# Patient Record
Sex: Male | Born: 1955 | Race: Black or African American | Hispanic: No | Marital: Single | Smoking: Never smoker
Health system: Southern US, Community
[De-identification: ages and names within clinical notes are randomized; demographics above are authoritative.]

## PROBLEM LIST (undated history)

## (undated) DIAGNOSIS — I10 Essential (primary) hypertension: Secondary | ICD-10-CM

## (undated) DIAGNOSIS — K429 Umbilical hernia without obstruction or gangrene: Secondary | ICD-10-CM

## (undated) DIAGNOSIS — E119 Type 2 diabetes mellitus without complications: Secondary | ICD-10-CM

## (undated) DIAGNOSIS — IMO0001 Reserved for inherently not codable concepts without codable children: Secondary | ICD-10-CM

## (undated) HISTORY — PX: CERVICAL FUSION: SHX112

## (undated) HISTORY — PX: ORIF PROXIMAL TIBIAL PLATEAU FRACTURE: SUR953

---

## 2000-10-05 ENCOUNTER — Emergency Department (HOSPITAL_COMMUNITY): Admission: EM | Admit: 2000-10-05 | Discharge: 2000-10-05 | Payer: Self-pay

## 2001-06-05 ENCOUNTER — Encounter: Payer: Self-pay | Admitting: Emergency Medicine

## 2001-06-05 ENCOUNTER — Emergency Department (HOSPITAL_COMMUNITY): Admission: EM | Admit: 2001-06-05 | Discharge: 2001-06-05 | Payer: Self-pay | Admitting: Emergency Medicine

## 2005-12-30 ENCOUNTER — Ambulatory Visit: Payer: Self-pay | Admitting: Internal Medicine

## 2005-12-30 LAB — CONVERTED CEMR LAB
ALT: 32 units/L (ref 0–40)
AST: 21 units/L (ref 0–37)
Albumin: 3.9 g/dL (ref 3.5–5.2)
Alkaline Phosphatase: 63 units/L (ref 39–117)
BUN: 15 mg/dL (ref 6–23)
Basophils Absolute: 0 10*3/uL (ref 0.0–0.1)
Basophils Relative: 0 % (ref 0.0–1.0)
Bilirubin Urine: NEGATIVE
CO2: 27 meq/L (ref 19–32)
Calcium: 9.7 mg/dL (ref 8.4–10.5)
Chloride: 105 meq/L (ref 96–112)
Chol/HDL Ratio, serum: 9.4
Cholesterol: 337 mg/dL (ref 0–200)
Creatinine, Ser: 1.5 mg/dL (ref 0.4–1.5)
Eosinophil percent: 5.6 % — ABNORMAL HIGH (ref 0.0–5.0)
GFR calc non Af Amer: 53 mL/min
Glomerular Filtration Rate, Af Am: 64 mL/min/{1.73_m2}
Glucose, Bld: 124 mg/dL — ABNORMAL HIGH (ref 70–99)
HCT: 44.2 % (ref 39.0–52.0)
HDL: 36 mg/dL — ABNORMAL LOW (ref >39.0)
Hemoglobin, Urine: NEGATIVE
Hemoglobin: 14.5 g/dL (ref 13.0–17.0)
Ketones, ur: NEGATIVE mg/dL
LDL DIRECT: 121.9 mg/dL
Leukocytes, UA: NEGATIVE
Lymphocytes Relative: 43 % (ref 12.0–46.0)
MCHC: 32.8 g/dL (ref 30.0–36.0)
MCV: 86.2 fL (ref 78.0–100.0)
Monocytes Absolute: 0.4 10*3/uL (ref 0.2–0.7)
Monocytes Relative: 6 % (ref 3.0–11.0)
Neutro Abs: 3 10*3/uL (ref 1.4–7.7)
Neutrophils Relative %: 45.4 % (ref 43.0–77.0)
Nitrite: NEGATIVE
PSA: 0.64 ng/mL (ref 0.10–4.00)
Platelets: 235 10*3/uL (ref 150–400)
Potassium: 4 meq/L (ref 3.5–5.1)
RBC: 5.13 M/uL (ref 4.22–5.81)
RDW: 13.1 % (ref 11.5–14.6)
Sodium: 141 meq/L (ref 135–145)
Specific Gravity, Urine: 1.025 (ref 1.000–1.03)
TSH: 1.86 microintl units/mL (ref 0.35–5.50)
Total Bilirubin: 0.8 mg/dL (ref 0.3–1.2)
Total Protein, Urine: NEGATIVE mg/dL
Total Protein: 7.3 g/dL (ref 6.0–8.3)
Triglyceride fasting, serum: 684 mg/dL (ref 0–149)
Urine Glucose: NEGATIVE mg/dL
Urobilinogen, UA: 0.2 (ref 0.0–1.0)
VLDL: 137 mg/dL — ABNORMAL HIGH (ref 0–40)
WBC: 6.6 10*3/uL (ref 4.5–10.5)
pH: 5.5 (ref 5.0–8.0)

## 2006-01-01 ENCOUNTER — Ambulatory Visit: Payer: Self-pay | Admitting: Internal Medicine

## 2006-02-07 ENCOUNTER — Ambulatory Visit: Payer: Self-pay | Admitting: Internal Medicine

## 2006-06-11 ENCOUNTER — Ambulatory Visit: Payer: Self-pay | Admitting: Internal Medicine

## 2006-06-11 LAB — CONVERTED CEMR LAB
ALT: 31 units/L (ref 0–40)
AST: 24 units/L (ref 0–37)
Albumin: 3.9 g/dL (ref 3.5–5.2)
Alkaline Phosphatase: 63 units/L (ref 39–117)
BUN: 9 mg/dL (ref 6–23)
Bilirubin, Direct: 0.1 mg/dL (ref 0.0–0.3)
CO2: 27 meq/L (ref 19–32)
Calcium: 9.5 mg/dL (ref 8.4–10.5)
Chloride: 107 meq/L (ref 96–112)
Cholesterol: 340 mg/dL (ref 0–200)
Creatinine, Ser: 1.2 mg/dL (ref 0.4–1.5)
Creatinine,U: 145.4 mg/dL
Direct LDL: 111 mg/dL
GFR calc Af Amer: 83 mL/min
GFR calc non Af Amer: 68 mL/min
Glucose, Bld: 98 mg/dL (ref 70–99)
HDL: 45.2 mg/dL (ref >39.0)
Hgb A1c MFr Bld: 6.7 % — ABNORMAL HIGH (ref 4.6–6.0)
Microalb Creat Ratio: 13.8 mg/g (ref 0.0–30.0)
Microalb, Ur: 2 mg/dL — ABNORMAL HIGH (ref 0.0–1.9)
Potassium: 4 meq/L (ref 3.5–5.1)
Sodium: 142 meq/L (ref 135–145)
Total Bilirubin: 0.8 mg/dL (ref 0.3–1.2)
Total CHOL/HDL Ratio: 7.5
Total Protein: 7.1 g/dL (ref 6.0–8.3)
Triglycerides: 919 mg/dL (ref 0–149)
VLDL: 184 mg/dL — ABNORMAL HIGH (ref 0–40)

## 2006-06-16 ENCOUNTER — Ambulatory Visit: Payer: Self-pay | Admitting: Internal Medicine

## 2006-06-24 ENCOUNTER — Ambulatory Visit: Payer: Self-pay | Admitting: Gastroenterology

## 2006-07-08 ENCOUNTER — Encounter: Payer: Self-pay | Admitting: Gastroenterology

## 2006-07-08 ENCOUNTER — Ambulatory Visit: Payer: Self-pay | Admitting: Gastroenterology

## 2006-11-10 ENCOUNTER — Ambulatory Visit: Payer: Self-pay | Admitting: Internal Medicine

## 2006-11-21 ENCOUNTER — Ambulatory Visit: Payer: Self-pay | Admitting: Internal Medicine

## 2006-11-21 LAB — CONVERTED CEMR LAB
ALT: 34 units/L (ref 0–53)
AST: 28 units/L (ref 0–37)
BUN: 13 mg/dL (ref 6–23)
CO2: 33 meq/L — ABNORMAL HIGH (ref 19–32)
Calcium: 9.8 mg/dL (ref 8.4–10.5)
Chloride: 106 meq/L (ref 96–112)
Cholesterol: 162 mg/dL (ref 0–200)
Creatinine, Ser: 1.3 mg/dL (ref 0.4–1.5)
Creatinine,U: 192 mg/dL
Direct LDL: 67.3 mg/dL
GFR calc Af Amer: 75 mL/min
GFR calc non Af Amer: 62 mL/min
Glucose, Bld: 89 mg/dL (ref 70–99)
HDL: 30.2 mg/dL — ABNORMAL LOW (ref >39.0)
Hgb A1c MFr Bld: 6.9 % — ABNORMAL HIGH (ref 4.6–6.0)
Microalb Creat Ratio: 8.3 mg/g (ref 0.0–30.0)
Microalb, Ur: 1.6 mg/dL (ref 0.0–1.9)
Potassium: 4.3 meq/L (ref 3.5–5.1)
Sodium: 144 meq/L (ref 135–145)
TSH: 1.32 microintl units/mL (ref 0.35–5.50)
Testosterone: 328.69 ng/dL — ABNORMAL LOW (ref 350.00–890)
Total CHOL/HDL Ratio: 5.4
Triglycerides: 378 mg/dL (ref 0–149)
VLDL: 76 mg/dL — ABNORMAL HIGH (ref 0–40)

## 2007-02-07 DIAGNOSIS — N183 Chronic kidney disease, stage 3 unspecified: Secondary | ICD-10-CM | POA: Insufficient documentation

## 2007-02-07 DIAGNOSIS — I10 Essential (primary) hypertension: Secondary | ICD-10-CM | POA: Insufficient documentation

## 2007-02-07 DIAGNOSIS — N189 Chronic kidney disease, unspecified: Secondary | ICD-10-CM | POA: Insufficient documentation

## 2007-02-07 DIAGNOSIS — M542 Cervicalgia: Secondary | ICD-10-CM

## 2007-02-07 DIAGNOSIS — E785 Hyperlipidemia, unspecified: Secondary | ICD-10-CM

## 2007-02-07 DIAGNOSIS — E119 Type 2 diabetes mellitus without complications: Secondary | ICD-10-CM | POA: Insufficient documentation

## 2007-02-12 ENCOUNTER — Ambulatory Visit: Payer: Self-pay | Admitting: Internal Medicine

## 2007-02-12 DIAGNOSIS — J069 Acute upper respiratory infection, unspecified: Secondary | ICD-10-CM | POA: Insufficient documentation

## 2007-02-12 DIAGNOSIS — F528 Other sexual dysfunction not due to a substance or known physiological condition: Secondary | ICD-10-CM

## 2007-02-12 DIAGNOSIS — N529 Male erectile dysfunction, unspecified: Secondary | ICD-10-CM | POA: Insufficient documentation

## 2007-03-02 ENCOUNTER — Encounter: Payer: Self-pay | Admitting: Internal Medicine

## 2007-05-13 ENCOUNTER — Encounter: Payer: Self-pay | Admitting: Internal Medicine

## 2010-05-18 ENCOUNTER — Inpatient Hospital Stay (HOSPITAL_COMMUNITY)
Admission: EM | Admit: 2010-05-18 | Discharge: 2010-05-23 | DRG: 494 | Disposition: A | Discharge status: Home Health | Payer: Non-veteran care | Source: Other Acute Inpatient Hospital | Attending: Orthopaedic Surgery | Admitting: Orthopaedic Surgery

## 2010-05-18 ENCOUNTER — Emergency Department (HOSPITAL_COMMUNITY)
Admission: EM | Admit: 2010-05-18 | Discharge: 2010-05-18 | Disposition: A | Discharge status: Other Acute Inpatient Hospital | Payer: Non-veteran care | Source: Home / Self Care | Attending: Emergency Medicine | Admitting: Emergency Medicine

## 2010-05-18 ENCOUNTER — Emergency Department (HOSPITAL_COMMUNITY): Payer: Non-veteran care

## 2010-05-18 DIAGNOSIS — S82109A Unspecified fracture of upper end of unspecified tibia, initial encounter for closed fracture: Principal | ICD-10-CM | POA: Diagnosis present

## 2010-05-18 DIAGNOSIS — S82409A Unspecified fracture of shaft of unspecified fibula, initial encounter for closed fracture: Secondary | ICD-10-CM | POA: Insufficient documentation

## 2010-05-18 DIAGNOSIS — Y998 Other external cause status: Secondary | ICD-10-CM

## 2010-05-18 DIAGNOSIS — Y92009 Unspecified place in unspecified non-institutional (private) residence as the place of occurrence of the external cause: Secondary | ICD-10-CM | POA: Insufficient documentation

## 2010-05-18 DIAGNOSIS — I10 Essential (primary) hypertension: Secondary | ICD-10-CM | POA: Diagnosis present

## 2010-05-18 DIAGNOSIS — Z01812 Encounter for preprocedural laboratory examination: Secondary | ICD-10-CM

## 2010-05-18 DIAGNOSIS — Y9352 Activity, horseback riding: Secondary | ICD-10-CM | POA: Insufficient documentation

## 2010-05-18 LAB — DIFFERENTIAL
Lymphocytes Relative: 35 % (ref 12–46)
Lymphs Abs: 2.1 10*3/uL (ref 0.7–4.0)
Monocytes Relative: 8 % (ref 3–12)
Neutro Abs: 3 10*3/uL (ref 1.7–7.7)
Neutrophils Relative %: 49 % (ref 43–77)

## 2010-05-18 LAB — BASIC METABOLIC PANEL
BUN: 12 mg/dL (ref 6–23)
CO2: 23 mEq/L (ref 19–32)
Chloride: 108 mEq/L (ref 96–112)
Creatinine, Ser: 1.33 mg/dL (ref 0.4–1.5)
Potassium: 4.2 mEq/L (ref 3.5–5.1)

## 2010-05-18 LAB — CBC
HCT: 39.2 % (ref 39.0–52.0)
Hemoglobin: 13.4 g/dL (ref 13.0–17.0)
MCH: 28.5 pg (ref 26.0–34.0)
MCV: 83.2 fL (ref 78.0–100.0)
RBC: 4.71 MIL/uL (ref 4.22–5.81)

## 2010-05-19 ENCOUNTER — Inpatient Hospital Stay (HOSPITAL_COMMUNITY): Payer: Non-veteran care

## 2010-05-19 LAB — GLUCOSE, CAPILLARY
Glucose-Capillary: 264 mg/dL — ABNORMAL HIGH (ref 70–99)
Glucose-Capillary: 141 mg/dL — ABNORMAL HIGH (ref 70–99)
Glucose-Capillary: 242 mg/dL — ABNORMAL HIGH (ref 70–99)
Glucose-Capillary: 235 mg/dL — ABNORMAL HIGH (ref 70–99)

## 2010-05-20 LAB — GLUCOSE, CAPILLARY
Glucose-Capillary: 161 mg/dL — ABNORMAL HIGH (ref 70–99)
Glucose-Capillary: 92 mg/dL (ref 70–99)
Glucose-Capillary: 162 mg/dL — ABNORMAL HIGH (ref 70–99)
Glucose-Capillary: 119 mg/dL — ABNORMAL HIGH (ref 70–99)

## 2010-05-21 LAB — GLUCOSE, CAPILLARY
Glucose-Capillary: 106 mg/dL — ABNORMAL HIGH (ref 70–99)
Glucose-Capillary: 161 mg/dL — ABNORMAL HIGH (ref 70–99)
Glucose-Capillary: 119 mg/dL — ABNORMAL HIGH (ref 70–99)

## 2010-05-22 LAB — GLUCOSE, CAPILLARY: Glucose-Capillary: 88 mg/dL (ref 70–99)

## 2010-05-23 LAB — GLUCOSE, CAPILLARY
Glucose-Capillary: 156 mg/dL — ABNORMAL HIGH (ref 70–99)
Glucose-Capillary: 183 mg/dL — ABNORMAL HIGH (ref 70–99)

## 2010-05-29 NOTE — Op Note (Signed)
NAME:  Joseph Conrad, Joseph Conrad           ACCOUNT NO.:  [id number]  MEDICAL RECORD NO.:  [id number]           PATIENT TYPE:  I  LOCATION:  5001                         FACILITY:  [facility]  PHYSICIAN:  Roberto Hlavaty C. Lorin Mercy, M.D.    DATE OF BIRTH:  October 20, 1955  DATE OF PROCEDURE:  05/19/2010 DATE OF DISCHARGE:                              OPERATIVE REPORT   POSTOPERATIVE DIAGNOSIS:  Left bicondylar tibial plateau fracture with comminution.  PROCEDURE:  ORIF bicondylar tibial plateau fracture with posterior medial plating and lateral plating.  SURGEON:  Rodell Perna, MD  ANESTHESIA:  GOT.  TOURNIQUET TIME:  Up time 67 minutes x350, down x15 and up time 40 minutes.  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  He suffered the above comminuted injury documented by CT scan with a large posterior medial fragment displaced.  After informed consent, standard prep and draping, proximal tourniquet application, Foley insertion, leg was prepped from the 10/15 drape applied at the tourniquet down to the ankle, the usual impervious stockinette, Coban, extremity sheets and drapes were applied.  It had been discussed with the patient this was severe comminuted fracture.  It may require total knee arthroplasty and even require plating based on CT findings with the large fragment displaced.  After time-out procedure, sterile skin marker, Betadine, Steri-Strips application, leg was elevated, wrapped in Esmarch and the posterior medial incision was made interval between the semitendinosus, medial hamstrings, and gastrocs were developed.  Portion of the popliteal popliteus peeled off the cortex exposing the fracture fragment.  The patient had a very large gastroc which made exposure very difficult. With distraction, the piece was getting better position, however,  the side of the leg with the patient flexed and externally rotated would displace the fragment.  With distraction with pressure, the patient got better, however, did not allow exposure.  Continued work was performed finally using a large tenaculum clamp for compression against the anterior portion of the tibial tubercle.  It held and K-wires were inserted.  A titanium 3.5 compression plate DePuy was selected custom bent, fashioned up against plateau and then screws were inserted buttressing the depressed fragment.  Non locking screws were placed on the ends and this was compressed down.  One of the screws were too long, went into the fibula and this was changed near the end of the case. Once this was confirmed, this took an hour of difficult work in order to finally get the piece reduced.  Once it was fixed, attention was turned laterally.  Tourniquet was deflated while the medial side was being irrigated, cauterization, and then layered closure with 0 Vicryl deep fascia, 2-0 Vicryl subcutaneous tissue, and then skin staple closure. The skin was not closed at the very end of the case and attention was turned to the lateral side.  The leg was elevated, again wrapped in Esmarch, tourniquet inflated.  S-shaped incision was opened. Subperiosteal dissection was performed.  Fracture fragments were reduced, compressed.  The DePuy  anatomic lateral plate was selected and placed in the femur and non locking screws were placed proximally.  The patient had a large leg.  One of the angle screws, little bit long was removed back down and distally a nonlocking screw was placed at end of the case.  It was exchanged for a locking screw.  Screwdriver was inserted over the medial side and the long angled screw that had gone into the fibula was removed.  This was a 70 exchanged for 55 which was appropriate length.  Final spot pictures were taken showing good position and alignment.  Repeat irrigation and  then standard layered closure with 0 and #1 Vicryl in the deep fascia, interrupted after Hemovac drain was placed.  Lateral side was dry.  Compartments were soft throughout the case.  The patient had palpable pulses.  Spot fluoro pictures were taken showing position and Xeroform, 4x4s, Webril, Ace wrap, and reapplication of the knee immobilizer performed after skin had been closed with the staples medial and laterally.  The patient was transferred to recovery room in stable condition.     Joseph Conrad C. Lorin Mercy, M.D.     MCY/MEDQ  D:  05/19/2010  T:  05/20/2010  Job:  [id number]  Electronically Signed by Rodell Perna M.D. on 05/29/2010 06:01:22 PM

## 2010-07-02 NOTE — Discharge Summary (Signed)
NAME:  Joseph Conrad, Joseph Conrad           ACCOUNT NO.:  [id number]  MEDICAL RECORD NO.:  [id number]           PATIENT TYPE:  I  LOCATION:  5001                         FACILITY:  [facility]  PHYSICIAN:  Brandon Scarbrough C. Lorin Mercy, M.D.    DATE OF BIRTH:  [birth year]  DATE OF ADMISSION:  05/18/2010 DATE OF DISCHARGE:  05/23/2010                              DISCHARGE SUMMARY   ADMISSION DIAGNOSES: 1. Left bicondylar tibial plateau fracture. 2. Insulin-dependent diabetes mellitus. 3. Obesity. 4. Hypertension.  DISCHARGE DIAGNOSES: 1. Left bicondylar tibial plateau fracture. 2. Insulin-dependent diabetes mellitus. 3. Obesity. 4. Hypertension.  PROCEDURE:  On May 19, 2010, the patient underwent open reduction and internal fixation bicondylar tibial plateau fracture with posterior medial plating and lateral plating performed by Dr. Lorin Mercy under general anesthesia.  CONSULTATIONS:  None.  BRIEF HISTORY:  The patient is a 55 year old male who has fallen from a horse on the day of admission.  He was brought to the emergency department where radiographs showed a bicondylar fracture of the left tibial plateau.  He underwent CT scan showing large posterior medial fragment which was displaced.  It was felt he would benefit from surgical intervention.  Compartments were checked and he was found to be without evidence of compartment syndrome.  The patient underwent the above-stated procedure on May 19, 2010, under general anesthesia without complications.  Postoperatively, neurovascular motor function of lower extremities was intact and he continued to have soft compartments. Strict elevation was utilized.  Knee immobilizer was utilized.  He was started on physical therapy for ambulation and gait training touchdown weightbearing to the operative extremity.  The patient was treated with IV analgesics initially and weaned to p.o. analgesics without difficulty.  He did have elevated temperatures which was  treated with incentive spirometry.  The patient was on enteric-coated aspirin 325 mg p.o. b.i.d.  At discharge, he was placed on Coumadin 1 mg daily for 25 days by Dr. Lorin Mercy for DVT prophylaxis.  Prior to discharge, he was able to ambulate to the hallway and maintain his touchdown weightbearing status.  Occupational Therapy assisted with ADLs and he was independent at the time of discharge.  PERTINENT LABORATORY VALUES:  The only labs available on the chart are those of his blood sugars.  I am unable to locate the usual preoperative labs which are performed.  PLAN:  The patient was discharged to his home.  He was instructed to continue wearing his knee immobilizer full time and to be touchdown weightbearing on the operative extremity utilizing a walker.  He will keep his dressing dry and clean at all times.  He will follow up with Dr. Lorin Mercy in 7 days.  MEDICATIONS AT DISCHARGE: 1. Percocet 5/325 one to two every 4-6 hours as needed for pain. 2. Coumadin 1 mg daily for 25 days. 3. Robaxin 500 mg one every 6 hours as needed for spasm.  He will continue on his home medication as taken prior to admission and was given a medication reconciliation form with these instructions.  The patient will call the office should he have questions or concerns prior to his return office visit.  CONDITION ON DISCHARGE:  Stable.     Epimenio Foot, P.A.   ______________________________ Thana Farr Lorin Mercy, M.D.    SMV/MEDQ  D:  06/21/2010  T:  06/21/2010  Job:  [id number]  Electronically Signed by Phillips Hay P.A. on 06/22/2010 12:46:20 PM Electronically Signed by Rodell Perna M.D. on 07/02/2010 04:11:38 PM

## 2010-07-20 NOTE — Assessment & Plan Note (Signed)
[facility]                             PRIMARY CARE OFFICE NOTE   NAME:Konkel, BAKARY KARRICK                  MRN:          [id number]  DATE:01/01/2006                            DOB:          [birth year]    CHIEF COMPLAINT:  New patient to practice.   HISTORY OF PRESENT ILLNESS:  The patient is a 55 year old African-American  male here to establish primary care.  Although he had seen Dr. Melvyn Novas in the  past of [facility], he has not had a local primary care  physician.  He was being followed by a [facility] out of [place], [place].  He states that he was diagnosed with type 2 diabetes in 2006.  His initial blood sugar when he was first hospitalized was 943.  Since that  time, the patient has undergone diabetic education and was taking insulin  which was ultimately discontinued due to the fact that his blood sugar had  improved significantly and was stable on Metformin therapy.  He did run out  of Metformin approximately 2 weeks ago, has been unable to get refills from  the [place], which he is somewhat frustrated with.   He also had hyperlipidemia but has not taken any cholesterol medication  recently.  He was prescribed a statin in the past.  His previous notes also  list Lopid 600 mg b.i.d., last prescribed in 2002.   He is up to date with diabetic eye exam from the [place].  He was, at one point,  taking a baby aspirin but he discontinued.   He denies any history of cardiovascular disease including MI or CVA.  No  history of peripheral vascular disease.   REVIEW OF SYSTEMS:  The patient denies any chest pain or shortness of  breath.  He has mild exercise intolerance.  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  He does not exercise on a regular basis and he has been taking an over-the-  counter supplement for the weight loss within the last 1 month time.   CURRENT  MEDICATIONS:  1. Lisinopril/hydrochlorothiazide 20/12.5 one a day.  2. Metformin 1000 mg b.i.d.   ALLERGIES TO MEDICATIONS:  None known.   PAST MEDICAL HISTORY SUMMARY:  1. Type 2 diabetes.  2. Hyperlipidemia.  3. Morbid obesity.  4. History of neck pain status post cervical fusion in the remote past.   SOCIAL HISTORY:  The patient is divorced.  He has been married twice.  He  has a total of 6 children from his first and his second marriage combined.   FAMILY HISTORY:  Father deceased at age 38, had a past medical history of  lung cancer and was diabetic.  Mother is alive at age 90.  She has  hypertension, history of CVA.  The patient has 4 siblings; one sister is  known to be hypertensive.  Denies any family history of colon cancer.   HABITS:  Occasional alcohol.  Has never used tobacco products in the past.   LABORATORY DATA:  CBC showed H and H of 14.5 and 44.2.  WBCs 6.6.  Platelet  count of 235.  Comprehensive metabolic profile notable for BUN 15,  creatinine 1.5.  Fasting glucose of 124.  His lipid panel shows a total  cholesterol 337, triglycerides 684, HDL 36 and a direct LDL of 121.9.  His  TSH was normal at 1.86.  His PSA was 0.64 and UA was negative for protein.  Microalbumin had not been performed.   PHYSICAL EXAM:  VITALS:  Height is 5 feet 9 inches.  Weight is 244 pounds.  Temperature is 98.6.  Pulse is 94.  BP is 148/84.  GENERAL:  The patient is a pleasant, overweight, 55 year old African-  American male in no apparent distress.  HEENT:  Normocephalic, atraumatic.  Pupils equal and reactive to light  bilaterally.  Extraocular motility was intact.  The patient was anicteric.  Conjunctivae was within normal limits.  External auditory canals were  intact.  Membranes were clear bilaterally.  Hearing grossly normal.  Upper  airway/oropharynx was somewhat crowded but no oropharyngeal lesions noted.  NECK:  Thick but supple.  No adenopathy, carotid bruits, question  thyroid  fullness.  Examination of the posterior aspect of his neck did reveal  hyperpigmentation and thickened skin, perhaps acanthosis nigricans.  CHEST:  Normal respiratory effort.  Chest was clear to auscultation  bilaterally. No rhonchi, rales, or wheezing.  CARDIOVASCULAR:  Regular rate and rhythm.  No significant murmurs, rubs, or  gallops appreciated.  ABDOMEN:  Protuberant but nontender.  Positive bowel sounds.  No  organomegaly.  MUSCULOSKELETAL:  No clubbing, cyanosis, or edema.  The patient had intact  dorsalis pedis pulses, slightly diminished on the left side and retained  sensation of his temperature and vibration of his lower extremities.  NEUROLOGIC:  Cranial nerves II through XII were grossly intact.  He was  nonfocal and his mood and affect were appropriate.   IMPRESSION/RECOMMENDATIONS:  1. Type 2 diabetes, unknown status.  2. Dyslipidemia.  3. Hypertension, uncontrolled.  4. Chronic renal insufficiency.  5. History of cervical fusion.  6. Health maintenance.   RECOMMENDATIONS:  Due to his mild renal insufficiency, the patient's  metformin will be decreased to 500 mg b.i.d. with meals.  We have discussed  the adverse effect of lactic acidosis with continuing Metformin if his  kidney function continues to deteriorate.   We discussed avoiding any NSAIDs and the patient was advised to only take  p.r.n. Tylenol as needed for aches and pains or headache.   He is to immediately discontinue his weight loss supplement.  I suspect this  is worsening his blood pressure control.   We will reevaluate his blood pressure once he has stopped the weight loss  supplement x1 month.   In terms of his hyperlipidemia, he will be started on Simvastatin 40 mg p.o.  q.h.s..  We discussed the need for followup LFTs and the patient is to  monitor for any myalgias or weakness.   He was updated today with an influenza vaccine and before our next visit, we will check a hemoglobin  A1c, liver function tests, and fasting lipid profile  as well as microalbumin and creatinine ratio.   In general, we discussed at length the need to decrease his cardiovascular  risk considering his clinical background/diabetes.   Followup time is in approximately 4-6 weeks.     Sandy Salaam. [name], DO  Electronically Signed    RDY/MedQ  DD: 01/01/2006  DT: 01/01/2006  Job #:  [id number] 

## 2011-05-17 ENCOUNTER — Encounter: Payer: Self-pay | Admitting: Gastroenterology

## 2012-06-09 ENCOUNTER — Encounter: Payer: Self-pay | Admitting: Gastroenterology

## 2015-06-23 ENCOUNTER — Ambulatory Visit: Payer: Self-pay | Admitting: Surgery

## 2015-06-23 NOTE — H&P (Signed)
History of Present Illness Joseph Conrad. Joseph Payeur MD; 06/23/2015 10:33 AM) Patient words: umb hernia.  The patient is a 60 year old male who presents with an umbilical hernia. PCP - Dr. [name] Referred by [facility]  This is a 60 year old male who presents with more than a 10 year history of a slowly enlarging umbilical hernia. This has become larger and more uncomfortable. The patient has a lot of reflux symptoms but has no obstructive symptoms. He was evaluated at the [place] in [place] but is referred here for surgical evaluation for repair. No imaging.   Other Problems Marjean Donna, CMA; 06/23/2015 9:48 AM) Arthritis Diabetes Mellitus High blood pressure Hypercholesterolemia  Past Surgical History Marjean Donna, CMA; 06/23/2015 9:48 AM) Knee Surgery Left. Spinal Surgery - Neck  Diagnostic Studies History Marjean Donna, CMA; 06/23/2015 9:48 AM) Colonoscopy 1-5 years ago  Allergies Davy Pique Bynum, CMA; 06/23/2015 9:50 AM) No Known Drug Allergies 06/23/2015  Medication History (Sonya Bynum, CMA; 06/23/2015 9:51 AM) Aspirin (81MG  Tablet Chewable, Oral) Active. Lantus SoloStar (100UNIT/ML Soln Pen-inj, Subcutaneous) Active. Atorvastatin Calcium (20MG  Tablet, Oral) Active. Lisinopril (20MG  Tablet, Oral) Active. Carvedilol (3.125MG  Tablet, Oral) Active. Medications Reconciled  Social History Marjean Donna, CMA; 06/23/2015 9:48 AM) Alcohol use Occasional alcohol use. Caffeine use Tea. Tobacco use Never smoker.  Family History Marjean Donna, CMA; 06/23/2015 9:48 AM) Diabetes Mellitus Father. Hypertension Father. Ischemic Bowel Disease Father.     Review of Systems ([place]; 06/23/2015 9:48 AM) General Present- Fatigue and Weight Gain. Not Present- Appetite Loss, Chills, Fever, Night Sweats and Weight Loss. Skin Not Present- Change in Wart/Mole, Dryness, Hives, Jaundice, New Lesions, Non-Healing Wounds, Rash and Ulcer. HEENT Present- Seasonal Allergies. Not Present-  Earache, Hearing Loss, Hoarseness, Nose Bleed, Oral Ulcers, Ringing in the Ears, Sinus Pain, Sore Throat, Visual Disturbances, Wears glasses/contact lenses and Yellow Eyes. Respiratory Present- Snoring. Not Present- Bloody sputum, Chronic Cough, Difficulty Breathing and Wheezing. Breast Not Present- Breast Mass, Breast Pain, Nipple Discharge and Skin Changes. Cardiovascular Present- Swelling of Extremities. Not Present- Chest Pain, Difficulty Breathing Lying Down, Leg Cramps, Palpitations, Rapid Heart Rate and Shortness of Breath. Gastrointestinal Present- Indigestion. Not Present- Abdominal Pain, Bloating, Bloody Stool, Change in Bowel Habits, Chronic diarrhea, Constipation, Difficulty Swallowing, Excessive gas, Gets full quickly at meals, Hemorrhoids, Nausea, Rectal Pain and Vomiting. Male Genitourinary Present- Nocturia. Not Present- Blood in Urine, Change in Urinary Stream, Frequency, Impotence, Painful Urination, Urgency and Urine Leakage.  Vitals (Sonya Bynum CMA; 06/23/2015 9:49 AM) 06/23/2015 9:49 AM Weight: 270.8 lb Height: 68in Body Surface Area: 2.32 m Body Mass Index: 41.17 kg/m  Temp.: 81F(Temporal)  Pulse: 81 (Regular)  BP: 134/80 (Sitting, Left Arm, Standard)      Physical Exam Rodman Key K. Ryli Standlee MD; 06/23/2015 10:34 AM)  The physical exam findings are as follows: Note:WDWN in NAD HEENT: EOMI, sclera anicteric Neck: No masses, no thyromegaly Lungs: CTA bilaterally; normal respiratory effort CV: Regular rate and rhythm; no murmurs Abd: +bowel sounds, soft, non-tender, protuberant; visible protruding umbilical hernia - reducible; enlarges with Valsalva Ext: Well-perfused; no edema Skin: Warm, dry; no sign of jaundice    Assessment & Plan Rodman Key K. Aanyah Loa MD; [id number] AM)  UMBILICAL HERNIA WITHOUT OBSTRUCTION OR GANGRENE (K42.9)  Current Plans Schedule for Surgery - Umbilical hernia repair with mesh. The surgical procedure has been discussed with  the patient. Potential risks, benefits, alternative treatments, and expected outcomes have been explained. All of the patient's questions at this time have been answered. The likelihood of reaching the patient's  treatment goal is good. The patient understand the proposed surgical procedure and wishes to proceed.   Joseph Conrad. Georgette Dover, MD, [facility] Surgery  General/ Trauma Surgery  06/23/2015 10:34 AM

## 2015-07-24 ENCOUNTER — Encounter (HOSPITAL_BASED_OUTPATIENT_CLINIC_OR_DEPARTMENT_OTHER): Payer: Self-pay | Admitting: *Deleted

## 2015-07-24 NOTE — Progress Notes (Signed)
   07/24/15 1524  OBSTRUCTIVE SLEEP APNEA  Have you ever been diagnosed with sleep apnea through a sleep study? No  Do you snore loudly (loud enough to be heard through closed doors)?  1  Do you often feel tired, fatigued, or sleepy during the daytime (such as falling asleep during driving or talking to someone)? 1  Has anyone observed you stop breathing during your sleep? 0  Do you have, or are you being treated for high blood pressure? 1  BMI more than 35 kg/m2? 1  Age > 19 (1-yes) 1  Male Gender (Yes=1) 1  Obstructive Sleep Apnea Score 6

## 2015-07-25 NOTE — Progress Notes (Signed)
Chart reviewed by Dr Al Corpus, aware that patient is scheduled for sleep study next month. OK for [facility].

## 2015-07-26 ENCOUNTER — Other Ambulatory Visit: Payer: Self-pay

## 2015-07-26 ENCOUNTER — Encounter (HOSPITAL_BASED_OUTPATIENT_CLINIC_OR_DEPARTMENT_OTHER)
Admission: RE | Admit: 2015-07-26 | Discharge: 2015-07-26 | Disposition: A | Discharge status: Home / Self Care | Payer: No Typology Code available for payment source | Source: Ambulatory Visit | Attending: Surgery | Admitting: Surgery

## 2015-07-26 DIAGNOSIS — Z794 Long term (current) use of insulin: Secondary | ICD-10-CM | POA: Diagnosis not present

## 2015-07-26 DIAGNOSIS — E119 Type 2 diabetes mellitus without complications: Secondary | ICD-10-CM | POA: Diagnosis not present

## 2015-07-26 DIAGNOSIS — Z6841 Body Mass Index (BMI) 40.0 and over, adult: Secondary | ICD-10-CM | POA: Diagnosis not present

## 2015-07-26 DIAGNOSIS — I10 Essential (primary) hypertension: Secondary | ICD-10-CM | POA: Diagnosis not present

## 2015-07-26 DIAGNOSIS — Z79899 Other long term (current) drug therapy: Secondary | ICD-10-CM | POA: Diagnosis not present

## 2015-07-26 DIAGNOSIS — K429 Umbilical hernia without obstruction or gangrene: Secondary | ICD-10-CM | POA: Diagnosis not present

## 2015-07-26 DIAGNOSIS — M1990 Unspecified osteoarthritis, unspecified site: Secondary | ICD-10-CM | POA: Diagnosis not present

## 2015-07-26 DIAGNOSIS — Z7984 Long term (current) use of oral hypoglycemic drugs: Secondary | ICD-10-CM | POA: Diagnosis not present

## 2015-07-26 DIAGNOSIS — Z7982 Long term (current) use of aspirin: Secondary | ICD-10-CM | POA: Diagnosis not present

## 2015-07-26 DIAGNOSIS — E7800 Pure hypercholesterolemia, unspecified: Secondary | ICD-10-CM | POA: Diagnosis not present

## 2015-07-26 LAB — BASIC METABOLIC PANEL
Anion gap: 6 (ref 5–15)
BUN: 18 mg/dL (ref 6–20)
CALCIUM: 9.2 mg/dL (ref 8.9–10.3)
CO2: 24 mmol/L (ref 22–32)
CREATININE: 1.87 mg/dL — AB (ref 0.61–1.24)
Chloride: 110 mmol/L (ref 101–111)
GFR calc Af Amer: 44 mL/min — ABNORMAL LOW (ref >60)
GFR, EST NON AFRICAN AMERICAN: 38 mL/min — AB (ref >60)
GLUCOSE: 121 mg/dL — AB (ref 65–99)
Potassium: 4.7 mmol/L (ref 3.5–5.1)
SODIUM: 140 mmol/L (ref 135–145)

## 2015-07-27 ENCOUNTER — Encounter (HOSPITAL_BASED_OUTPATIENT_CLINIC_OR_DEPARTMENT_OTHER)
Admission: RE | Disposition: A | Discharge status: Home / Self Care | Payer: Self-pay | Source: Ambulatory Visit | Attending: Surgery

## 2015-07-27 ENCOUNTER — Ambulatory Visit (HOSPITAL_BASED_OUTPATIENT_CLINIC_OR_DEPARTMENT_OTHER)
Admission: RE | Admit: 2015-07-27 | Discharge: 2015-07-27 | Disposition: A | Discharge status: Home / Self Care | Payer: No Typology Code available for payment source | Source: Ambulatory Visit | Attending: Surgery | Admitting: Surgery

## 2015-07-27 ENCOUNTER — Encounter (HOSPITAL_BASED_OUTPATIENT_CLINIC_OR_DEPARTMENT_OTHER): Payer: Self-pay | Admitting: Anesthesiology

## 2015-07-27 ENCOUNTER — Ambulatory Visit (HOSPITAL_BASED_OUTPATIENT_CLINIC_OR_DEPARTMENT_OTHER): Payer: No Typology Code available for payment source | Admitting: Anesthesiology

## 2015-07-27 DIAGNOSIS — Z6841 Body Mass Index (BMI) 40.0 and over, adult: Secondary | ICD-10-CM | POA: Insufficient documentation

## 2015-07-27 DIAGNOSIS — Z79899 Other long term (current) drug therapy: Secondary | ICD-10-CM | POA: Insufficient documentation

## 2015-07-27 DIAGNOSIS — Z794 Long term (current) use of insulin: Secondary | ICD-10-CM | POA: Insufficient documentation

## 2015-07-27 DIAGNOSIS — E119 Type 2 diabetes mellitus without complications: Secondary | ICD-10-CM | POA: Insufficient documentation

## 2015-07-27 DIAGNOSIS — Z7982 Long term (current) use of aspirin: Secondary | ICD-10-CM | POA: Insufficient documentation

## 2015-07-27 DIAGNOSIS — I10 Essential (primary) hypertension: Secondary | ICD-10-CM | POA: Insufficient documentation

## 2015-07-27 DIAGNOSIS — K429 Umbilical hernia without obstruction or gangrene: Secondary | ICD-10-CM | POA: Insufficient documentation

## 2015-07-27 DIAGNOSIS — Z7984 Long term (current) use of oral hypoglycemic drugs: Secondary | ICD-10-CM | POA: Insufficient documentation

## 2015-07-27 DIAGNOSIS — M1990 Unspecified osteoarthritis, unspecified site: Secondary | ICD-10-CM | POA: Insufficient documentation

## 2015-07-27 DIAGNOSIS — E7800 Pure hypercholesterolemia, unspecified: Secondary | ICD-10-CM | POA: Insufficient documentation

## 2015-07-27 HISTORY — PX: UMBILICAL HERNIA REPAIR: SHX196

## 2015-07-27 HISTORY — DX: Essential (primary) hypertension: I10

## 2015-07-27 HISTORY — DX: Reserved for inherently not codable concepts without codable children: IMO0001

## 2015-07-27 HISTORY — DX: Type 2 diabetes mellitus without complications: E11.9

## 2015-07-27 HISTORY — DX: Umbilical hernia without obstruction or gangrene: K42.9

## 2015-07-27 LAB — GLUCOSE, CAPILLARY
GLUCOSE-CAPILLARY: 152 mg/dL — AB (ref 65–99)
GLUCOSE-CAPILLARY: 152 mg/dL — AB (ref 65–99)

## 2015-07-27 SURGERY — REPAIR, HERNIA, UMBILICAL, ADULT
Anesthesia: General | Site: Abdomen

## 2015-07-27 MED ORDER — ONDANSETRON HCL 4 MG/2ML IJ SOLN
INTRAMUSCULAR | Status: AC
  Filled 2015-07-27: qty 2

## 2015-07-27 MED ORDER — MIDAZOLAM HCL 2 MG/2ML IJ SOLN: 1.0000 mg | INTRAMUSCULAR | Status: DC | PRN

## 2015-07-27 MED ORDER — GLYCOPYRROLATE 0.2 MG/ML IJ SOLN: 0.2000 mg | Freq: Once | INTRAMUSCULAR | Status: DC | PRN

## 2015-07-27 MED ORDER — ONDANSETRON HCL 4 MG/2ML IJ SOLN
INTRAMUSCULAR | Status: AC
  Filled 2015-07-27: qty 8

## 2015-07-27 MED ORDER — SUGAMMADEX SODIUM 200 MG/2ML IV SOLN
INTRAVENOUS | Status: DC | PRN
  Administered 2015-07-27: 200 mg via INTRAVENOUS

## 2015-07-27 MED ORDER — SCOPOLAMINE 1 MG/3DAYS TD PT72: 1.0000 | MEDICATED_PATCH | Freq: Once | TRANSDERMAL | Status: DC | PRN

## 2015-07-27 MED ORDER — DEXTROSE 5 % IV SOLN
3.0000 g | INTRAVENOUS | Status: AC
  Administered 2015-07-27: 3 g via INTRAVENOUS

## 2015-07-27 MED ORDER — DEXAMETHASONE SODIUM PHOSPHATE 4 MG/ML IJ SOLN
INTRAMUSCULAR | Status: DC | PRN
  Administered 2015-07-27: 10 mg via INTRAVENOUS

## 2015-07-27 MED ORDER — CHLORHEXIDINE GLUCONATE 4 % EX LIQD: 1.0000 "application " | Freq: Once | CUTANEOUS | Status: DC

## 2015-07-27 MED ORDER — LACTATED RINGERS IV SOLN
INTRAVENOUS | Status: DC
  Administered 2015-07-27 (×2): via INTRAVENOUS

## 2015-07-27 MED ORDER — MORPHINE SULFATE (PF) 2 MG/ML IV SOLN: 2.0000 mg | INTRAVENOUS | Status: DC | PRN

## 2015-07-27 MED ORDER — FENTANYL CITRATE (PF) 100 MCG/2ML IJ SOLN: 50.0000 ug | INTRAMUSCULAR | Status: DC | PRN

## 2015-07-27 MED ORDER — ONDANSETRON HCL 4 MG/2ML IJ SOLN: 4.0000 mg | INTRAMUSCULAR | Status: DC | PRN

## 2015-07-27 MED ORDER — PROPOFOL 10 MG/ML IV BOLUS
INTRAVENOUS | Status: AC
  Filled 2015-07-27: qty 20

## 2015-07-27 MED ORDER — ROCURONIUM BROMIDE 100 MG/10ML IV SOLN
INTRAVENOUS | Status: DC | PRN
  Administered 2015-07-27: 30 mg via INTRAVENOUS

## 2015-07-27 MED ORDER — DEXAMETHASONE SODIUM PHOSPHATE 10 MG/ML IJ SOLN
INTRAMUSCULAR | Status: AC
  Filled 2015-07-27: qty 1

## 2015-07-27 MED ORDER — CEFAZOLIN SODIUM 1-5 GM-% IV SOLN
INTRAVENOUS | Status: AC
  Filled 2015-07-27: qty 50

## 2015-07-27 MED ORDER — OXYCODONE HCL 5 MG PO TABS
ORAL_TABLET | ORAL | Status: AC
  Filled 2015-07-27: qty 1

## 2015-07-27 MED ORDER — PROPOFOL 10 MG/ML IV BOLUS
INTRAVENOUS | Status: DC | PRN
  Administered 2015-07-27: 400 mg via INTRAVENOUS

## 2015-07-27 MED ORDER — PHENYLEPHRINE HCL 10 MG/ML IJ SOLN
INTRAMUSCULAR | Status: DC | PRN
  Administered 2015-07-27 (×2): 80 ug via INTRAVENOUS

## 2015-07-27 MED ORDER — SUGAMMADEX SODIUM 200 MG/2ML IV SOLN
INTRAVENOUS | Status: AC
  Filled 2015-07-27: qty 2

## 2015-07-27 MED ORDER — OXYCODONE-ACETAMINOPHEN 5-325 MG PO TABS: 1.0000 | ORAL_TABLET | ORAL | Status: DC | PRN

## 2015-07-27 MED ORDER — MIDAZOLAM HCL 2 MG/2ML IJ SOLN
INTRAMUSCULAR | Status: AC
  Filled 2015-07-27: qty 2

## 2015-07-27 MED ORDER — FENTANYL CITRATE (PF) 100 MCG/2ML IJ SOLN
INTRAMUSCULAR | Status: AC
  Filled 2015-07-27: qty 2

## 2015-07-27 MED ORDER — LIDOCAINE HCL (CARDIAC) 20 MG/ML IV SOLN
INTRAVENOUS | Status: DC | PRN
  Administered 2015-07-27: 50 mg via INTRAVENOUS

## 2015-07-27 MED ORDER — BUPIVACAINE-EPINEPHRINE 0.25% -1:200000 IJ SOLN
INTRAMUSCULAR | Status: DC | PRN
  Administered 2015-07-27: 10 mL

## 2015-07-27 MED ORDER — MIDAZOLAM HCL 5 MG/5ML IJ SOLN
INTRAMUSCULAR | Status: DC | PRN
  Administered 2015-07-27: 2 mg via INTRAVENOUS

## 2015-07-27 MED ORDER — OXYCODONE HCL 5 MG PO TABS
5.0000 mg | ORAL_TABLET | Freq: Once | ORAL | Status: AC
  Administered 2015-07-27: 5 mg via ORAL

## 2015-07-27 MED ORDER — PHENYLEPHRINE 40 MCG/ML (10ML) SYRINGE FOR IV PUSH (FOR BLOOD PRESSURE SUPPORT)
PREFILLED_SYRINGE | INTRAVENOUS | Status: AC
  Filled 2015-07-27: qty 10

## 2015-07-27 MED ORDER — CEFAZOLIN SODIUM-DEXTROSE 2-4 GM/100ML-% IV SOLN
INTRAVENOUS | Status: AC
  Filled 2015-07-27: qty 100

## 2015-07-27 MED ORDER — SUCCINYLCHOLINE CHLORIDE 20 MG/ML IJ SOLN
INTRAMUSCULAR | Status: DC | PRN
  Administered 2015-07-27: 100 mg via INTRAVENOUS

## 2015-07-27 MED ORDER — OXYCODONE-ACETAMINOPHEN 5-325 MG PO TABS
1.0000 | ORAL_TABLET | ORAL | Status: DC | PRN
Start: 2015-07-27 — End: 2015-07-27

## 2015-07-27 MED ORDER — FENTANYL CITRATE (PF) 100 MCG/2ML IJ SOLN
INTRAMUSCULAR | Status: DC | PRN
  Administered 2015-07-27: 100 ug via INTRAVENOUS

## 2015-07-27 MED ORDER — ONDANSETRON HCL 4 MG/2ML IJ SOLN
INTRAMUSCULAR | Status: DC | PRN
  Administered 2015-07-27: 4 mg via INTRAVENOUS

## 2015-07-27 MED ORDER — ROCURONIUM BROMIDE 50 MG/5ML IV SOLN
INTRAVENOUS | Status: AC
  Filled 2015-07-27: qty 1

## 2015-07-27 SURGICAL SUPPLY — 63 items
APL SKNCLS STERI-STRIP NONHPOA (GAUZE/BANDAGES/DRESSINGS) ×2
APPLICATOR COTTON TIP 6IN STRL (MISCELLANEOUS) IMPLANT
BENZOIN TINCTURE PRP APPL 2/3 (GAUZE/BANDAGES/DRESSINGS) ×4 IMPLANT
BLADE CLIPPER SURG (BLADE) ×4 IMPLANT
BLADE HEX COATED 2.75 (ELECTRODE) ×4 IMPLANT
BLADE SURG 15 STRL LF DISP TIS (BLADE) ×2 IMPLANT
BLADE SURG 15 STRL SS (BLADE) ×4
CANISTER SUCT 1200ML W/VALVE (MISCELLANEOUS) IMPLANT
CHLORAPREP W/TINT 26ML (MISCELLANEOUS) ×4 IMPLANT
CLOSURE WOUND 1/2 X4 (GAUZE/BANDAGES/DRESSINGS) ×1
COVER BACK TABLE 60X90IN (DRAPES) ×4 IMPLANT
COVER MAYO STAND STRL (DRAPES) ×4 IMPLANT
DECANTER SPIKE VIAL GLASS SM (MISCELLANEOUS) ×3 IMPLANT
DRAPE LAPAROTOMY T 102X78X121 (DRAPES) ×4 IMPLANT
DRAPE UTILITY XL STRL (DRAPES) ×4 IMPLANT
DRSG TEGADERM 4X4.75 (GAUZE/BANDAGES/DRESSINGS) ×4 IMPLANT
ELECT REM PT RETURN 9FT ADLT (ELECTROSURGICAL) ×4
ELECTRODE REM PT RTRN 9FT ADLT (ELECTROSURGICAL) ×2 IMPLANT
GLOVE BIO SURGEON STRL SZ 6.5 (GLOVE) ×2 IMPLANT
GLOVE BIO SURGEON STRL SZ7 (GLOVE) ×4 IMPLANT
GLOVE BIO SURGEONS STRL SZ 6.5 (GLOVE) ×1
GLOVE BIOGEL PI IND STRL 7.0 (GLOVE) ×1 IMPLANT
GLOVE BIOGEL PI IND STRL 7.5 (GLOVE) ×3 IMPLANT
GLOVE BIOGEL PI INDICATOR 7.0 (GLOVE) ×2
GLOVE BIOGEL PI INDICATOR 7.5 (GLOVE) ×4
GLOVE SURG SS PI 6.5 STRL IVOR (GLOVE) ×3 IMPLANT
GLOVE SURG SS PI 7.0 STRL IVOR (GLOVE) ×3 IMPLANT
GOWN STRL REUS W/ TWL LRG LVL3 (GOWN DISPOSABLE) ×5 IMPLANT
GOWN STRL REUS W/TWL LRG LVL3 (GOWN DISPOSABLE) ×12
NDL HYPO 25X1 1.5 SAFETY (NEEDLE) ×1 IMPLANT
NDL SAFETY ECLIPSE 18X1.5 (NEEDLE) IMPLANT
NEEDLE HYPO 18GX1.5 SHARP (NEEDLE)
NEEDLE HYPO 25X1 1.5 SAFETY (NEEDLE) ×4 IMPLANT
NS IRRIG 1000ML POUR BTL (IV SOLUTION) IMPLANT
PACK BASIN DAY SURGERY FS (CUSTOM PROCEDURE TRAY) ×4 IMPLANT
PENCIL BUTTON HOLSTER BLD 10FT (ELECTRODE) ×4 IMPLANT
SLEEVE SCD COMPRESS KNEE MED (MISCELLANEOUS) ×4 IMPLANT
SPONGE GAUZE 2X2 8PLY STER LF (GAUZE/BANDAGES/DRESSINGS) ×1
SPONGE GAUZE 2X2 8PLY STRL LF (GAUZE/BANDAGES/DRESSINGS) ×3 IMPLANT
SPONGE GAUZE 4X4 12PLY STER LF (GAUZE/BANDAGES/DRESSINGS) IMPLANT
STAPLER VISISTAT 35W (STAPLE) IMPLANT
STRIP CLOSURE SKIN 1/2X4 (GAUZE/BANDAGES/DRESSINGS) ×3 IMPLANT
SUT MNCRL AB 4-0 PS2 18 (SUTURE) ×4 IMPLANT
SUT NOVA 0 T19/GS 22DT (SUTURE) ×8 IMPLANT
SUT NOVA NAB DX-16 0-1 5-0 T12 (SUTURE) ×7 IMPLANT
SUT PDS AB 0 CT 36 (SUTURE) IMPLANT
SUT PROLENE 0 CT 1 CR/8 (SUTURE) IMPLANT
SUT SILK 3 0 TIES 17X18 (SUTURE)
SUT SILK 3-0 18XBRD TIE BLK (SUTURE) IMPLANT
SUT VIC AB 2-0 CT1 27 (SUTURE)
SUT VIC AB 2-0 CT1 TAPERPNT 27 (SUTURE) IMPLANT
SUT VIC AB 3-0 SH 27 (SUTURE) ×4
SUT VIC AB 3-0 SH 27X BRD (SUTURE) ×2 IMPLANT
SUT VIC AB 4-0 BRD 54 (SUTURE) IMPLANT
SUT VIC AB 4-0 SH 18 (SUTURE) IMPLANT
SUT VICRYL 4-0 PS2 18IN ABS (SUTURE) IMPLANT
SYR BULB 3OZ (MISCELLANEOUS) IMPLANT
SYR CONTROL 10ML LL (SYRINGE) ×4 IMPLANT
TOWEL OR 17X24 6PK STRL BLUE (TOWEL DISPOSABLE) ×4 IMPLANT
TOWEL OR NON WOVEN STRL DISP B (DISPOSABLE) ×4 IMPLANT
TUBE CONNECTING 20'X1/4 (TUBING)
TUBE CONNECTING 20X1/4 (TUBING) IMPLANT
YANKAUER SUCT BULB TIP NO VENT (SUCTIONS) IMPLANT

## 2015-07-27 NOTE — Discharge Instructions (Signed)
[facility]  UMBILICAL OR INGUINAL HERNIA REPAIR: POST OP INSTRUCTIONS  Always review your discharge instruction sheet given to you by the facility where your surgery was performed. IF YOU HAVE DISABILITY OR FAMILY LEAVE FORMS, YOU MUST BRING THEM TO THE OFFICE FOR PROCESSING.   DO NOT GIVE THEM TO YOUR DOCTOR.  1. A  prescription for pain medication may be given to you upon discharge.  Take your pain medication as prescribed, if needed.  If narcotic pain medicine is not needed, then you may take acetaminophen (Tylenol) or ibuprofen (Advil) as needed. 2. Take your usually prescribed medications unless otherwise directed. 3. If you need a refill on your pain medication, please contact your pharmacy.  They will contact our office to request authorization. Prescriptions will not be filled after 5 pm or on week-ends. 4. You should follow a light diet the first 24 hours after arrival home, such as soup and crackers, etc.  Be sure to include lots of fluids daily.  Resume your normal diet the day after surgery. 5. Most patients will experience some swelling and bruising around the umbilicus or in the groin and scrotum.  Ice packs and reclining will help.  Swelling and bruising can take several days to resolve.  6. It is common to experience some constipation if taking pain medication after surgery.  Increasing fluid intake and taking a stool softener (such as Colace) will usually help or prevent this problem from occurring.  A mild laxative (Milk of Magnesia or Miralax) should be taken according to package directions if there are no bowel movements after 48 hours. 7. Unless discharge instructions indicate otherwise, you may remove your bandages 24-48 hours after surgery, and you may shower at that time.  You will have steri-strips (small skin tapes) in place directly over the incision.  These strips should be left on the skin for 7-10 days. 8. ACTIVITIES:  You may resume regular (light)  daily activities beginning the next day--such as daily self-care, walking, climbing stairs--gradually increasing activities as tolerated.  You may have sexual intercourse when it is comfortable.  Refrain from any heavy lifting or straining until approved by your doctor. a. You may drive when you are no longer taking prescription pain medication, you can comfortably wear a seatbelt, and you can safely maneuver your car and apply brakes. b. RETURN TO WORK:  2-3 weeks with light duty - no lifting over 15 lbs. 9. You should see your doctor in the office for a follow-up appointment approximately 2-3 weeks after your surgery.  Make sure that you call for this appointment within a day or two after you arrive home to insure a convenient appointment time. 10. OTHER INSTRUCTIONS:  __________________________________________________________________________________________________________________________________________________________________________________________  WHEN TO CALL YOUR DOCTOR: 1. Fever over 101.0 2. Inability to urinate 3. Nausea and/or vomiting 4. Extreme swelling or bruising 5. Continued bleeding from incision. 6. Increased pain, redness, or drainage from the incision  The clinic staff is available to answer your questions during regular business hours.  Please don't hesitate to call and ask to speak to one of the nurses for clinical concerns.  If you have a medical emergency, go to the nearest emergency room or call 911.  A surgeon from [facility] is always on call at the hospital   [address], [address], [place], [place]  [address] ?  [address] [address], [place], [place]   [address] [phone]    [phone]    FAX [phone] Web   site: [email or web address]   Post Anesthesia Home Care Instructions  Activity: Get plenty of rest for the remainder of the day. A responsible adult should stay with you for 24 hours following the procedure.  For the next 24  hours, DO NOT: -Drive a car -Paediatric nurse -Drink alcoholic beverages -Take any medication unless instructed by your physician -Make any legal decisions or sign important papers.  Meals: Start with liquid foods such as gelatin or soup. Progress to regular foods as tolerated. Avoid greasy, spicy, heavy foods. If nausea and/or vomiting occur, drink only clear liquids until the nausea and/or vomiting subsides. Call your physician if vomiting continues.  Special Instructions/Symptoms: Your throat may feel dry or sore from the anesthesia or the breathing tube placed in your throat during surgery. If this causes discomfort, gargle with warm salt water. The discomfort should disappear within 24 hours.  If you had a scopolamine patch placed behind your ear for the management of post- operative nausea and/or vomiting:  1. The medication in the patch is effective for 72 hours, after which it should be removed.  Wrap patch in a tissue and discard in the trash. Wash hands thoroughly with soap and water. 2. You may remove the patch earlier than 72 hours if you experience unpleasant side effects which may include dry mouth, dizziness or visual disturbances. 3. Avoid touching the patch. Wash your hands with soap and water after contact with the patch.

## 2015-07-27 NOTE — Anesthesia Procedure Notes (Signed)
Procedure Name: Intubation Date/Time: 07/27/2015 10:52 AM Performed by: Marrianne Mood Pre-anesthesia Checklist: Patient identified, Emergency Drugs available, Suction available, Patient being monitored and Timeout performed Patient Re-evaluated:Patient Re-evaluated prior to inductionOxygen Delivery Method: Circle System Utilized Preoxygenation: Pre-oxygenation with 100% oxygen Intubation Type: IV induction Ventilation: Mask ventilation without difficulty and Two handed mask ventilation required Laryngoscope Size: 3 and Glidescope Grade View: Grade III Tube type: Oral Tube size: 8.0 mm Number of attempts: 1 Airway Equipment and Method: Stylet and Oral airway Placement Confirmation: ETT inserted through vocal cords under direct vision,  positive ETCO2 and breath sounds checked- equal and bilateral Secured at: 22 cm Tube secured with: Tape Dental Injury: Teeth and Oropharynx as per pre-operative assessment  Difficulty Due To: Difficult Airway- due to anterior larynx, Difficult Airway-  due to edematous airway, Difficulty was anticipated and Difficult Airway- due to large tongue

## 2015-07-27 NOTE — H&P (Signed)
  History of Present Illness  Patient words: umb hernia.  PCP - Dr. [name] Referred by [facility]  This is a 60 year old male who presents with more than a 10 year history of a slowly enlarging umbilical hernia. This has become larger and more uncomfortable. The patient has a lot of reflux symptoms but has no obstructive symptoms. He was evaluated at the [place] in [place] but is referred here for surgical evaluation for repair. No imaging.   Other Problems  Arthritis Diabetes Mellitus High blood pressure Hypercholesterolemia  Past Surgical History  Knee Surgery Left. Spinal Surgery - Neck  Diagnostic Studies History  Colonoscopy 1-5 years ago  Allergies  No Known Drug Allergies 06/23/2015  Medication History Aspirin (81MG  Tablet Chewable, Oral) Active. Lantus SoloStar (100UNIT/ML Soln Pen-inj, Subcutaneous) Active. Atorvastatin Calcium (20MG  Tablet, Oral) Active. Lisinopril (20MG  Tablet, Oral) Active. Carvedilol (3.125MG  Tablet, Oral) Active. Medications Reconciled  Social History  Alcohol use Occasional alcohol use. Caffeine use Tea. Tobacco use Never smoker.  Family History  Diabetes Mellitus Father. Hypertension Father. Ischemic Bowel Disease Father.     Review of Systems  General Present- Fatigue and Weight Gain. Not Present- Appetite Loss, Chills, Fever, Night Sweats and Weight Loss. Skin Not Present- Change in Wart/Mole, Dryness, Hives, Jaundice, New Lesions, Non-Healing Wounds, Rash and Ulcer. HEENT Present- Seasonal Allergies. Not Present- Earache, Hearing Loss, Hoarseness, Nose Bleed, Oral Ulcers, Ringing in the Ears, Sinus Pain, Sore Throat, Visual Disturbances, Wears glasses/contact lenses and Yellow Eyes. Respiratory Present- Snoring. Not Present- Bloody sputum, Chronic Cough, Difficulty Breathing and Wheezing. Breast Not Present- Breast Mass, Breast Pain, Nipple Discharge and Skin Changes. Cardiovascular Present- Swelling of Extremities.  Not Present- Chest Pain, Difficulty Breathing Lying Down, Leg Cramps, Palpitations, Rapid Heart Rate and Shortness of Breath. Gastrointestinal Present- Indigestion. Not Present- Abdominal Pain, Bloating, Bloody Stool, Change in Bowel Habits, Chronic diarrhea, Constipation, Difficulty Swallowing, Excessive gas, Gets full quickly at meals, Hemorrhoids, Nausea, Rectal Pain and Vomiting. Male Genitourinary Present- Nocturia. Not Present- Blood in Urine, Change in Urinary Stream, Frequency, Impotence, Painful Urination, Urgency and Urine Leakage.  Vitals  Weight: 270.8 lb Height: 68in Body Surface Area: 2.32 m Body Mass Index: 41.17 kg/m  Temp.: 96F(Temporal)  Pulse: 81 (Regular)  BP: 134/80 (Sitting, Left Arm, Standard)      Physical Exam   The physical exam findings are as follows: Note:WDWN in NAD HEENT: EOMI, sclera anicteric Neck: No masses, no thyromegaly Lungs: CTA bilaterally; normal respiratory effort CV: Regular rate and rhythm; no murmurs Abd: +bowel sounds, soft, non-tender, protuberant; visible protruding umbilical hernia - reducible; enlarges with Valsalva Ext: Well-perfused; no edema Skin: Warm, dry; no sign of jaundice    Assessment & Plan   UMBILICAL HERNIA WITHOUT OBSTRUCTION OR GANGRENE (K42.9)  Current Plans Schedule for Surgery - Umbilical hernia repair with mesh. The surgical procedure has been discussed with the patient. Potential risks, benefits, alternative treatments, and expected outcomes have been explained. All of the patient's questions at this time have been answered. The likelihood of reaching the patient's treatment goal is good. The patient understand the proposed surgical procedure and wishes to proceed.  Imogene Burn. Georgette Dover, MD, [facility]  General/ Trauma Surgery  07/27/2015 10:37 AM

## 2015-07-27 NOTE — Anesthesia Preprocedure Evaluation (Signed)
Anesthesia Evaluation  Patient identified by MRN, date of birth, ID band Patient awake    Reviewed: Allergy & Precautions, NPO status , Patient's Chart, lab work & pertinent test results, reviewed documented beta blocker date and time   Airway Mallampati: II  TM Distance: >3 FB Neck ROM: Full    Dental  (+) Teeth Intact, Dental Advisory Given   Pulmonary    breath sounds clear to auscultation       Cardiovascular hypertension, Pt. on medications and Pt. on home beta blockers  Rhythm:Regular Rate:Normal     Neuro/Psych    GI/Hepatic   Endo/Other  diabetes, Well Controlled, Type 2, Oral Hypoglycemic Agents, Insulin DependentMorbid obesity  Renal/GU      Musculoskeletal   Abdominal   Peds  Hematology   Anesthesia Other Findings   Reproductive/Obstetrics                             Anesthesia Physical Anesthesia Plan  ASA: III  Anesthesia Plan: General   Post-op Pain Management:    Induction: Intravenous  Airway Management Planned: Oral ETT  Additional Equipment:   Intra-op Plan:   Post-operative Plan: Extubation in OR  Informed Consent: I have reviewed the patients History and Physical, chart, labs and discussed the procedure including the risks, benefits and alternatives for the proposed anesthesia with the patient or authorized representative who has indicated his/her understanding and acceptance.   Dental advisory given  Plan Discussed with: CRNA, Anesthesiologist and Surgeon  Anesthesia Plan Comments:         Anesthesia Quick Evaluation

## 2015-07-27 NOTE — Op Note (Signed)
Indications:  The patient presented with a history of an enlarging umbilical hernia.  The patient was examined and we recommended umbilical hernia repair with mesh.  Pre-operative diagnosis:  Umbilical hernia  Post-operative diagnosis:  Same  Procedure:  Umbilical hernia repair - no mesh  Surgeon: Hercules Hasler K.   Assistants: none  Anesthesia: General endotracheal anesthesia  ASA Class: 2   Procedure Details  The patient was seen again in the Holding Room. The risks, benefits, complications, treatment options, and expected outcomes were discussed with the patient. The possibilities of reaction to medication, pulmonary aspiration, perforation of viscus, bleeding, recurrent infection, the need for additional procedures, and development of a complication requiring transfusion or further operation were discussed with the patient and/or family. There was concurrence with the proposed plan, and informed consent was obtained. The site of surgery was properly noted/marked. The patient was taken to the Operating Room, identified as Joseph Conrad, and the procedure verified as umbilical hernia repair. A Time Out was held and the above information confirmed.  After an adequate level of general anesthesia was obtained, the patient's abdomen was prepped with Chloraprep and draped in sterile fashion.  We made a transverse incision above the umbilicus.  Dissection was carried down to the hernia sac with cautery.  We dissected around the hernia sac down to the edge of the fascial defect.  As we were dissecting the hernia sac away from the overlying skin, a small "buttonhole" was made in the skin.  We excised this area sharply and closed the skin with 4-0 Monocryl.  We reduced the hernia sac back into the pre-peritoneal space.  The fascial defect measured about 6 mm.  We cleared the fascia in all directions. The fascial defect was closed with multiple interrupted figure-of-eight 1 Novofil sutures.  The  base of the umbilicus was tacked down with 3-0 Vicryl.  3-0 Vicryl was used to close the subcutaneous tissues and 4-0 Monocryl was used to close the skin.  Steri-strips and clean dressing were applied.  The patient was extubated and brought to the recovery room in stable condition.  All sponge, instrument, and needle counts were correct prior to closure and at the conclusion of the case.   Estimated Blood Loss: Minimal          Complications: None; patient tolerated the procedure well.         Disposition: PACU - hemodynamically stable.         Condition: stable  Joseph Conrad. Georgette Dover, MD, [facility]  General/ Trauma Surgery  07/27/2015 11:36 AM

## 2015-07-27 NOTE — Anesthesia Postprocedure Evaluation (Signed)
Anesthesia Post Note  Patient: Joseph Conrad  Procedure(s) Performed: Procedure(s) (LRB): HERNIA REPAIR UMBILICAL ADULT (N/A)  Patient location during evaluation: PACU Anesthesia Type: General Level of consciousness: awake and alert Pain management: pain level controlled Vital Signs Assessment: post-procedure vital signs reviewed and stable Respiratory status: spontaneous breathing, nonlabored ventilation and respiratory function stable Cardiovascular status: blood pressure returned to baseline and stable Postop Assessment: no signs of nausea or vomiting Anesthetic complications: no    Last Vitals:  Filed Vitals:   07/27/15 1215 07/27/15 1300  BP: 141/83 138/88  Pulse:  74  Temp:  36.7 C  Resp:      Last Pain:  Filed Vitals:   07/27/15 1304  PainSc: 2                  Neeko Pharo A

## 2015-07-27 NOTE — Transfer of Care (Signed)
Immediate Anesthesia Transfer of Care Note  Patient: Joseph Conrad  Procedure(s) Performed: Procedure(s): HERNIA REPAIR UMBILICAL ADULT (N/A)  Patient Location: PACU  Anesthesia Type:General  Level of Consciousness: awake and patient cooperative  Airway & Oxygen Therapy: Patient Spontanous Breathing and Patient connected to face mask oxygen  Post-op Assessment: Report given to RN and Post -op Vital signs reviewed and stable  Post vital signs: Reviewed and stable  Last Vitals:  Filed Vitals:   07/27/15 0940 07/27/15 1152  BP: 167/99   Pulse: 70   Temp: 36.6 C   Resp: 20 18    Last Pain: There were no vitals filed for this visit.       Complications: No apparent anesthesia complications

## 2015-07-28 ENCOUNTER — Encounter (HOSPITAL_BASED_OUTPATIENT_CLINIC_OR_DEPARTMENT_OTHER): Payer: Self-pay | Admitting: Surgery

## 2016-05-27 ENCOUNTER — Emergency Department (HOSPITAL_COMMUNITY)
Admission: EM | Admit: 2016-05-27 | Discharge: 2016-05-27 | Disposition: A | Discharge status: Home / Self Care | Payer: Non-veteran care | Attending: Emergency Medicine | Admitting: Emergency Medicine

## 2016-05-27 ENCOUNTER — Encounter (HOSPITAL_COMMUNITY): Payer: Self-pay | Admitting: *Deleted

## 2016-05-27 DIAGNOSIS — I10 Essential (primary) hypertension: Secondary | ICD-10-CM | POA: Diagnosis not present

## 2016-05-27 DIAGNOSIS — Z7982 Long term (current) use of aspirin: Secondary | ICD-10-CM | POA: Insufficient documentation

## 2016-05-27 DIAGNOSIS — Z794 Long term (current) use of insulin: Secondary | ICD-10-CM | POA: Insufficient documentation

## 2016-05-27 DIAGNOSIS — E119 Type 2 diabetes mellitus without complications: Secondary | ICD-10-CM | POA: Insufficient documentation

## 2016-05-27 DIAGNOSIS — R748 Abnormal levels of other serum enzymes: Secondary | ICD-10-CM | POA: Diagnosis present

## 2016-05-27 DIAGNOSIS — R7989 Other specified abnormal findings of blood chemistry: Secondary | ICD-10-CM

## 2016-05-27 LAB — CBC WITH DIFFERENTIAL/PLATELET
BASOS ABS: 0 10*3/uL (ref 0.0–0.1)
BASOS PCT: 0 %
EOS ABS: 0.3 10*3/uL (ref 0.0–0.7)
EOS PCT: 4 %
HCT: 32.9 % — ABNORMAL LOW (ref 39.0–52.0)
Hemoglobin: 10.6 g/dL — ABNORMAL LOW (ref 13.0–17.0)
Lymphocytes Relative: 32 %
Lymphs Abs: 2.4 10*3/uL (ref 0.7–4.0)
MCH: 27.3 pg (ref 26.0–34.0)
MCHC: 32.2 g/dL (ref 30.0–36.0)
MCV: 84.8 fL (ref 78.0–100.0)
MONO ABS: 0.5 10*3/uL (ref 0.1–1.0)
MONOS PCT: 6 %
Neutro Abs: 4.3 10*3/uL (ref 1.7–7.7)
Neutrophils Relative %: 58 %
Platelets: 207 10*3/uL (ref 150–400)
RBC: 3.88 MIL/uL — ABNORMAL LOW (ref 4.22–5.81)
RDW: 13.2 % (ref 11.5–15.5)
WBC: 7.5 10*3/uL (ref 4.0–10.5)

## 2016-05-27 LAB — COMPREHENSIVE METABOLIC PANEL
ALBUMIN: 3.7 g/dL (ref 3.5–5.0)
ALK PHOS: 61 U/L (ref 38–126)
ALT: 19 U/L (ref 17–63)
ANION GAP: 13 (ref 5–15)
AST: 18 U/L (ref 15–41)
BUN: 32 mg/dL — ABNORMAL HIGH (ref 6–20)
CHLORIDE: 105 mmol/L (ref 101–111)
CO2: 24 mmol/L (ref 22–32)
Calcium: 9.3 mg/dL (ref 8.9–10.3)
Creatinine, Ser: 3.18 mg/dL — ABNORMAL HIGH (ref 0.61–1.24)
GFR calc Af Amer: 23 mL/min — ABNORMAL LOW (ref >60)
GFR calc non Af Amer: 20 mL/min — ABNORMAL LOW (ref >60)
GLUCOSE: 137 mg/dL — AB (ref 65–99)
POTASSIUM: 3.8 mmol/L (ref 3.5–5.1)
SODIUM: 142 mmol/L (ref 135–145)
Total Bilirubin: 0.3 mg/dL (ref 0.3–1.2)
Total Protein: 6.5 g/dL (ref 6.5–8.1)

## 2016-05-27 NOTE — Discharge Instructions (Signed)
Drink plenty of fluids. Follow-up with primary care physician as soon as possible for renal ultrasound and referral to Nephrologist

## 2016-05-27 NOTE — ED Triage Notes (Addendum)
Pt was [facility] and told to return to [facility] immediately d/t elevated kidney function.  Pt states he just went to see his pcp today for a regular check-up.  Denies any sob, chest pain, nausea, edema or decreased urine output.

## 2016-05-27 NOTE — ED Provider Notes (Signed)
[name]   CSN: [id number] Arrival date & time: 05/27/16  1649     History   Chief Complaint Chief Complaint  Patient presents with  . ABNORMAL LABS    HPI Joseph Conrad is a 61 y.o. male.  The history is provided by the patient.  Illness  This is a new problem. The current episode started 6 to 12 hours ago. Pertinent negatives include no chest pain, no abdominal pain, no headaches and no shortness of breath. He has tried nothing for the symptoms.     61 y.o. male PMH IDDM, HTN presents for abnormal labs. Pt visited PCP at [facility] today for routine visit. Blood work showed increased creatinine and patient was sent to ED for evaluation. No recent antibiotics or new medication. No decreased urination. Denies pain, hematuria, N/V, myalgias.   Past Medical History:  Diagnosis Date  . Diabetes mellitus without complication (HCC)    IDDM  . Hypertension   . Shortness of breath dyspnea    with exertion  . Umbilical hernia     Patient Active Problem List   Diagnosis Date Noted  . ERECTILE DYSFUNCTION 02/12/2007  . UPPER RESPIRATORY INFECTION 02/12/2007  . DIABETES MELLITUS, TYPE II 02/07/2007  . HYPERLIPIDEMIA 02/07/2007  . MORBID OBESITY 02/07/2007  . HYPERTENSION 02/07/2007  . RENAL INSUFFICIENCY, CHRONIC 02/07/2007  . NECK PAIN 02/07/2007    Past Surgical History:  Procedure Laterality Date  . CERVICAL FUSION    . ORIF PROXIMAL TIBIAL PLATEAU FRACTURE Left   . UMBILICAL HERNIA REPAIR N/A 07/27/2015   Procedure: HERNIA REPAIR UMBILICAL ADULT;  Surgeon: Donnie Mesa, MD;  Location: [facility];  Service: General;  Laterality: N/A;       Home Medications    Prior to Admission medications   Medication Sig Start Date End Date Taking? Authorizing Provider  aspirin 81 MG tablet Take 81 mg by mouth daily.    Historical Provider, MD  atorvastatin (LIPITOR) 40 MG tablet Take 40 mg by mouth daily.    Historical Provider, MD    carvedilol (COREG) 25 MG tablet Take 25 mg by mouth 2 (two) times daily with a meal. Takes 2 tabs in am and 2 tabs at bedtime    Historical Provider, MD  doxycycline (VIBRA-TABS) 100 MG tablet Take 100 mg by mouth 2 (two) times daily.    Historical Provider, MD  insulin glargine (LANTUS) 100 UNIT/ML injection Inject 30 Units into the skin at bedtime.    Historical Provider, MD  insulin regular (NOVOLIN R,HUMULIN R) 100 units/mL injection Inject 15 Units into the skin 3 (three) times daily before meals.    Historical Provider, MD  lisinopril (PRINIVIL,ZESTRIL) 40 MG tablet Take 40 mg by mouth daily.    Historical Provider, MD  metFORMIN (GLUCOPHAGE) 1000 MG tablet Take 1,000 mg by mouth 2 (two) times daily with a meal.    Historical Provider, MD  oxyCODONE-acetaminophen (PERCOCET/ROXICET) 5-325 MG tablet Take 1 tablet by mouth every 4 (four) hours as needed for severe pain. 07/27/15   Donnie Mesa, MD    Family History No family history on file.  Social History Social History  Substance Use Topics  . Smoking status: Never Smoker  . Smokeless tobacco: Never Used  . Alcohol use No     Allergies   Patient has no known allergies.   Review of Systems Review of Systems  Constitutional: Negative for chills, fatigue and fever.  Respiratory: Negative for shortness of breath.  Cardiovascular: Negative for chest pain.  Gastrointestinal: Negative for abdominal pain, diarrhea, nausea and vomiting.  Genitourinary: Negative for decreased urine volume, difficulty urinating, dysuria, flank pain, frequency, hematuria and urgency.  Musculoskeletal: Negative for back pain.  Neurological: Negative for light-headedness and headaches.  All other systems reviewed and are negative.    Physical Exam Updated Vital Signs BP (!) 108/59 (BP Location: Right Arm)   Pulse 65   Temp 98.2 F (36.8 C) (Oral)   Resp 14   Ht 5\' 9"  (1.753 m)   Wt 112.9 kg   SpO2 98%   BMI 36.77 kg/m   Physical Exam   Constitutional: He is oriented to person, place, and time. He appears well-developed and well-nourished.  HENT:  Head: Normocephalic and atraumatic.  Eyes: Conjunctivae and EOM are normal.  Neck: Normal range of motion. Neck supple.  Cardiovascular: Normal rate and regular rhythm.   No murmur heard. Pulmonary/Chest: Effort normal and breath sounds normal. No respiratory distress.  Abdominal: Soft. There is no tenderness.  Musculoskeletal: Normal range of motion. He exhibits no edema.  Neurological: He is alert and oriented to person, place, and time.  Skin: Skin is warm and dry. Capillary refill takes less than 2 seconds.  Psychiatric: He has a normal mood and affect.  Nursing note and vitals reviewed.    ED Treatments / Results  Labs (all labs ordered are listed, but only abnormal results are displayed) Labs Reviewed  CBC WITH DIFFERENTIAL/PLATELET - Abnormal; Notable for the following:       Result Value   RBC 3.88 (*)    Hemoglobin 10.6 (*)    HCT 32.9 (*)    All other components within normal limits  COMPREHENSIVE METABOLIC PANEL - Abnormal; Notable for the following:    Glucose, Bld 137 (*)    BUN 32 (*)    Creatinine, Ser 3.18 (*)    GFR calc non Af Amer 20 (*)    GFR calc Af Amer 23 (*)    All other components within normal limits    EKG  EKG Interpretation None       Radiology No results found.  Procedures Procedures (including critical care time)  Medications Ordered in ED Medications - No data to display   Initial Impression / Assessment and Plan / ED Course  I have reviewed the triage vital signs and the nursing notes.  Pertinent labs & imaging results that were available during my care of the patient were reviewed by me and considered in my medical decision making (see chart for details).    61 y.o. male presents from PCP office for increased creatine. VSS, NAD. Physical exam unremarkable. Cr 3.18. Good urine out put. No pain or hematuria. No  nephrotoxic medications. No electrolyte abnormalities. Given overall well appearance and asymptomatic increase in Cr, will discharge with follow-up to Nephrology. Encouraged PO hydration. Strict return precautions given. Pt voiced understanding and agreement with plan.   Final Clinical Impressions(s) / ED Diagnoses   Final diagnoses:  Elevated serum creatinine    New Prescriptions Discharge Medication List as of 05/27/2016  9:02 PM       Monico Blitz, MD 05/28/16 0205    Carmin Muskrat, MD 05/28/16 1011

## 2016-05-27 NOTE — ED Notes (Signed)
ED Provider at bedside. 

## 2019-02-10 ENCOUNTER — Other Ambulatory Visit: Payer: Self-pay

## 2019-02-10 ENCOUNTER — Emergency Department (HOSPITAL_COMMUNITY): Payer: No Typology Code available for payment source

## 2019-02-10 ENCOUNTER — Inpatient Hospital Stay (HOSPITAL_COMMUNITY)
Admission: EM | Admit: 2019-02-10 | Discharge: 2019-02-13 | DRG: 392 | Disposition: A | Discharge status: Home / Self Care | Payer: No Typology Code available for payment source | Attending: Internal Medicine | Admitting: Internal Medicine

## 2019-02-10 ENCOUNTER — Encounter (HOSPITAL_COMMUNITY): Payer: Self-pay | Admitting: Emergency Medicine

## 2019-02-10 DIAGNOSIS — Z794 Long term (current) use of insulin: Secondary | ICD-10-CM

## 2019-02-10 DIAGNOSIS — K5732 Diverticulitis of large intestine without perforation or abscess without bleeding: Principal | ICD-10-CM | POA: Diagnosis present

## 2019-02-10 DIAGNOSIS — Z9119 Patient's noncompliance with other medical treatment and regimen: Secondary | ICD-10-CM

## 2019-02-10 DIAGNOSIS — I129 Hypertensive chronic kidney disease with stage 1 through stage 4 chronic kidney disease, or unspecified chronic kidney disease: Secondary | ICD-10-CM | POA: Diagnosis present

## 2019-02-10 DIAGNOSIS — Z79899 Other long term (current) drug therapy: Secondary | ICD-10-CM

## 2019-02-10 DIAGNOSIS — K760 Fatty (change of) liver, not elsewhere classified: Secondary | ICD-10-CM | POA: Diagnosis present

## 2019-02-10 DIAGNOSIS — E1165 Type 2 diabetes mellitus with hyperglycemia: Secondary | ICD-10-CM | POA: Diagnosis present

## 2019-02-10 DIAGNOSIS — E785 Hyperlipidemia, unspecified: Secondary | ICD-10-CM | POA: Diagnosis present

## 2019-02-10 DIAGNOSIS — R109 Unspecified abdominal pain: Secondary | ICD-10-CM | POA: Diagnosis not present

## 2019-02-10 DIAGNOSIS — K5792 Diverticulitis of intestine, part unspecified, without perforation or abscess without bleeding: Secondary | ICD-10-CM | POA: Diagnosis present

## 2019-02-10 DIAGNOSIS — Z7982 Long term (current) use of aspirin: Secondary | ICD-10-CM

## 2019-02-10 DIAGNOSIS — N1832 Chronic kidney disease, stage 3b: Secondary | ICD-10-CM | POA: Diagnosis present

## 2019-02-10 DIAGNOSIS — N183 Chronic kidney disease, stage 3 unspecified: Secondary | ICD-10-CM | POA: Diagnosis present

## 2019-02-10 DIAGNOSIS — Z20828 Contact with and (suspected) exposure to other viral communicable diseases: Secondary | ICD-10-CM | POA: Diagnosis present

## 2019-02-10 DIAGNOSIS — N184 Chronic kidney disease, stage 4 (severe): Secondary | ICD-10-CM

## 2019-02-10 DIAGNOSIS — Z981 Arthrodesis status: Secondary | ICD-10-CM

## 2019-02-10 DIAGNOSIS — I10 Essential (primary) hypertension: Secondary | ICD-10-CM | POA: Diagnosis present

## 2019-02-10 DIAGNOSIS — E1122 Type 2 diabetes mellitus with diabetic chronic kidney disease: Secondary | ICD-10-CM | POA: Diagnosis present

## 2019-02-10 DIAGNOSIS — Z6841 Body Mass Index (BMI) 40.0 and over, adult: Secondary | ICD-10-CM

## 2019-02-10 DIAGNOSIS — Z79891 Long term (current) use of opiate analgesic: Secondary | ICD-10-CM

## 2019-02-10 DIAGNOSIS — E119 Type 2 diabetes mellitus without complications: Secondary | ICD-10-CM

## 2019-02-10 DIAGNOSIS — G4733 Obstructive sleep apnea (adult) (pediatric): Secondary | ICD-10-CM | POA: Diagnosis present

## 2019-02-10 LAB — URINALYSIS, ROUTINE W REFLEX MICROSCOPIC
Bilirubin Urine: NEGATIVE
Glucose, UA: NEGATIVE mg/dL
Hgb urine dipstick: NEGATIVE
Ketones, ur: NEGATIVE mg/dL
Leukocytes,Ua: NEGATIVE
Nitrite: NEGATIVE
Protein, ur: 30 mg/dL — AB
Specific Gravity, Urine: 1.025 (ref 1.005–1.030)
pH: 5 (ref 5.0–8.0)

## 2019-02-10 LAB — LACTIC ACID, PLASMA
Lactic Acid, Venous: 0.7 mmol/L (ref 0.5–1.9)
Lactic Acid, Venous: 0.6 mmol/L (ref 0.5–1.9)

## 2019-02-10 LAB — CBC
HCT: 43.6 % (ref 39.0–52.0)
HCT: 41.1 % (ref 39.0–52.0)
Hemoglobin: 14.2 g/dL (ref 13.0–17.0)
Hemoglobin: 13.5 g/dL (ref 13.0–17.0)
MCH: 28.3 pg (ref 26.0–34.0)
MCH: 28.4 pg (ref 26.0–34.0)
MCHC: 32.6 g/dL (ref 30.0–36.0)
MCHC: 32.8 g/dL (ref 30.0–36.0)
MCV: 87 fL (ref 80.0–100.0)
MCV: 86.5 fL (ref 80.0–100.0)
Platelets: 187 10*3/uL (ref 150–400)
Platelets: 168 10*3/uL (ref 150–400)
RBC: 5.01 MIL/uL (ref 4.22–5.81)
RBC: 4.75 MIL/uL (ref 4.22–5.81)
RDW: 13.2 % (ref 11.5–15.5)
RDW: 13.3 % (ref 11.5–15.5)
WBC: 10.5 10*3/uL (ref 4.0–10.5)
WBC: 10.6 10*3/uL — ABNORMAL HIGH (ref 4.0–10.5)
nRBC: 0 % (ref 0.0–0.2)
nRBC: 0 % (ref 0.0–0.2)

## 2019-02-10 LAB — HEMOGLOBIN A1C
Hgb A1c MFr Bld: 6.9 % — ABNORMAL HIGH (ref 4.8–5.6)
Mean Plasma Glucose: 151.33 mg/dL

## 2019-02-10 LAB — COMPREHENSIVE METABOLIC PANEL
ALT: 30 U/L (ref 0–44)
AST: 21 U/L (ref 15–41)
Albumin: 3.7 g/dL (ref 3.5–5.0)
Alkaline Phosphatase: 64 U/L (ref 38–126)
Anion gap: 13 (ref 5–15)
BUN: 19 mg/dL (ref 8–23)
CO2: 23 mmol/L (ref 22–32)
Calcium: 9.8 mg/dL (ref 8.9–10.3)
Chloride: 101 mmol/L (ref 98–111)
Creatinine, Ser: 2.28 mg/dL — ABNORMAL HIGH (ref 0.61–1.24)
GFR calc Af Amer: 34 mL/min — ABNORMAL LOW (ref >60)
GFR calc non Af Amer: 30 mL/min — ABNORMAL LOW (ref >60)
Glucose, Bld: 183 mg/dL — ABNORMAL HIGH (ref 70–99)
Potassium: 4.1 mmol/L (ref 3.5–5.1)
Sodium: 137 mmol/L (ref 135–145)
Total Bilirubin: 0.7 mg/dL (ref 0.3–1.2)
Total Protein: 7.1 g/dL (ref 6.5–8.1)

## 2019-02-10 LAB — CREATININE, SERUM
Creatinine, Ser: 2.18 mg/dL — ABNORMAL HIGH (ref 0.61–1.24)
GFR calc Af Amer: 36 mL/min — ABNORMAL LOW (ref >60)
GFR calc non Af Amer: 31 mL/min — ABNORMAL LOW (ref >60)

## 2019-02-10 LAB — HIV ANTIBODY (ROUTINE TESTING W REFLEX): HIV Screen 4th Generation wRfx: NONREACTIVE

## 2019-02-10 LAB — SARS CORONAVIRUS 2 (TAT 6-24 HRS): SARS Coronavirus 2: NEGATIVE

## 2019-02-10 LAB — CBG MONITORING, ED: Glucose-Capillary: 62 mg/dL — ABNORMAL LOW (ref 70–99)

## 2019-02-10 LAB — LIPASE, BLOOD: Lipase: 279 U/L — ABNORMAL HIGH (ref 11–51)

## 2019-02-10 MED ORDER — SODIUM CHLORIDE 0.9% FLUSH
3.0000 mL | Freq: Once | INTRAVENOUS | Status: AC
  Administered 2019-02-10: 3 mL via INTRAVENOUS

## 2019-02-10 MED ORDER — SODIUM CHLORIDE 0.9 % IV SOLN
INTRAVENOUS | Status: DC
  Administered 2019-02-10 – 2019-02-13 (×3): via INTRAVENOUS

## 2019-02-10 MED ORDER — LISINOPRIL 40 MG PO TABS
40.0000 mg | ORAL_TABLET | Freq: Every day | ORAL | Status: DC
  Administered 2019-02-11: 40 mg via ORAL
  Filled 2019-02-10: qty 1

## 2019-02-10 MED ORDER — CARVEDILOL 25 MG PO TABS
25.0000 mg | ORAL_TABLET | Freq: Two times a day (BID) | ORAL | Status: DC
  Administered 2019-02-10 – 2019-02-13 (×6): 25 mg via ORAL
  Filled 2019-02-10: qty 2
  Filled 2019-02-10 (×4): qty 1
  Filled 2019-02-10: qty 2
  Filled 2019-02-10: qty 1

## 2019-02-10 MED ORDER — SODIUM CHLORIDE 0.9 % IV SOLN
2.0000 g | INTRAVENOUS | Status: DC
  Administered 2019-02-10 – 2019-02-12 (×3): 2 g via INTRAVENOUS
  Filled 2019-02-10 (×2): qty 20
  Filled 2019-02-10: qty 2
  Filled 2019-02-10: qty 20

## 2019-02-10 MED ORDER — CIPROFLOXACIN IN D5W 400 MG/200ML IV SOLN: 400.0000 mg | Freq: Two times a day (BID) | INTRAVENOUS | Status: DC

## 2019-02-10 MED ORDER — METRONIDAZOLE IN NACL 5-0.79 MG/ML-% IV SOLN
500.0000 mg | Freq: Three times a day (TID) | INTRAVENOUS | Status: DC
  Administered 2019-02-10 – 2019-02-13 (×9): 500 mg via INTRAVENOUS
  Filled 2019-02-10 (×9): qty 100

## 2019-02-10 MED ORDER — ACETAMINOPHEN 325 MG PO TABS
650.0000 mg | ORAL_TABLET | Freq: Four times a day (QID) | ORAL | Status: DC | PRN
  Administered 2019-02-11 (×2): 650 mg via ORAL
  Filled 2019-02-10 (×2): qty 2

## 2019-02-10 MED ORDER — HYDROMORPHONE HCL 1 MG/ML IJ SOLN
0.5000 mg | Freq: Once | INTRAMUSCULAR | Status: AC
  Administered 2019-02-10: 0.5 mg via INTRAVENOUS
  Filled 2019-02-10: qty 1

## 2019-02-10 MED ORDER — INSULIN ASPART 100 UNIT/ML ~~LOC~~ SOLN: 0.0000 [IU] | Freq: Every day | SUBCUTANEOUS | Status: DC

## 2019-02-10 MED ORDER — ONDANSETRON HCL 4 MG PO TABS: 4.0000 mg | ORAL_TABLET | Freq: Four times a day (QID) | ORAL | Status: DC | PRN

## 2019-02-10 MED ORDER — HYDROMORPHONE HCL 1 MG/ML IJ SOLN
1.0000 mg | INTRAMUSCULAR | Status: DC | PRN
  Administered 2019-02-11: 1 mg via INTRAVENOUS
  Filled 2019-02-10: qty 1

## 2019-02-10 MED ORDER — ONDANSETRON HCL 4 MG/2ML IJ SOLN
4.0000 mg | Freq: Once | INTRAMUSCULAR | Status: AC
  Administered 2019-02-10: 4 mg via INTRAVENOUS
  Filled 2019-02-10: qty 2

## 2019-02-10 MED ORDER — ATORVASTATIN CALCIUM 40 MG PO TABS
40.0000 mg | ORAL_TABLET | Freq: Every day | ORAL | Status: DC
  Administered 2019-02-10 – 2019-02-13 (×4): 40 mg via ORAL
  Filled 2019-02-10 (×4): qty 1

## 2019-02-10 MED ORDER — ENOXAPARIN SODIUM 40 MG/0.4ML ~~LOC~~ SOLN
40.0000 mg | SUBCUTANEOUS | Status: DC
  Administered 2019-02-10 – 2019-02-12 (×3): 40 mg via SUBCUTANEOUS
  Filled 2019-02-10 (×3): qty 0.4

## 2019-02-10 MED ORDER — ASPIRIN EC 81 MG PO TBEC
81.0000 mg | DELAYED_RELEASE_TABLET | Freq: Every day | ORAL | Status: DC
  Administered 2019-02-10 – 2019-02-13 (×4): 81 mg via ORAL
  Filled 2019-02-10 (×4): qty 1

## 2019-02-10 MED ORDER — ONDANSETRON HCL 4 MG/2ML IJ SOLN: 4.0000 mg | Freq: Four times a day (QID) | INTRAMUSCULAR | Status: DC | PRN

## 2019-02-10 MED ORDER — MORPHINE SULFATE (PF) 4 MG/ML IV SOLN
4.0000 mg | Freq: Once | INTRAVENOUS | Status: AC
  Administered 2019-02-10: 4 mg via INTRAVENOUS
  Filled 2019-02-10: qty 1

## 2019-02-10 MED ORDER — ACETAMINOPHEN 650 MG RE SUPP: 650.0000 mg | Freq: Four times a day (QID) | RECTAL | Status: DC | PRN

## 2019-02-10 MED ORDER — INSULIN ASPART 100 UNIT/ML ~~LOC~~ SOLN
0.0000 [IU] | Freq: Three times a day (TID) | SUBCUTANEOUS | Status: DC
  Administered 2019-02-11 (×2): 3 [IU] via SUBCUTANEOUS
  Administered 2019-02-13: 2 [IU] via SUBCUTANEOUS

## 2019-02-10 NOTE — ED Notes (Signed)
Patient transported to CT 

## 2019-02-10 NOTE — ED Provider Notes (Signed)
4:07 PM BP 110/74   Pulse 83   Temp 99.5 F (37.5 C) (Oral)   Resp 19   SpO2 95%   Patient with intermittent abdominal pain x2 weeks constant and progressively worsening over the past 24 hours.  Pain in the right lower and right upper quadrant, worse with breathing, coughing, walking.  Denies a history of alcohol abuse.  Found to have elevated lipase.  Ultrasound of the right upper quadrant limited secondary to body habitus.  Patient has a elevated serum creatinine at 2.28 therefore will obtain a noncontrast CT scan of the abdomen.  On reevaluation of the patient patient's abdominal exam shows a distended abdomen with firmness and tenderness in the right upper quadrant with a seemingly positive Murphy sign.  Patient also tender in the right lower quadrant.  Patient is CT returned with evidence of right sided cecal diverticulitis.  Patient also has an elevated lipase level, peritoneal signs and significant pain.  He will be admitted by the hospitalist service.  He is hemodynamically stable.   Kenton Kingfisher, Vernie Shanks, PA-C 02/11/19 0012    Lennice Sites, DO 02/11/19 [phone]

## 2019-02-10 NOTE — ED Notes (Signed)
CBG Results of 62 reported to [place], RN.

## 2019-02-10 NOTE — ED Triage Notes (Signed)
C/o RLQ pain since yesterday.  Denies nausea, vomiting, diarrhea, and urinary complaints. ?

## 2019-02-10 NOTE — H&P (Addendum)
History and Physical    Joseph Conrad QQV:[id number] DOB: Feb 26, 1956 DOA: 02/10/2019  PCP: [name], [name], DO  Patient coming from: Home  I have personally briefly reviewed patient's old medical records in [place]  Chief Complaint: Right lower quadrant pain since last night  HPI: EVEREST BROD is a 63 y.o. male with medical history significant of hypertension, hyperlipidemia, diabetes mellitus, morbid obesity, obstructive sleep apnea-not on CPAP at home, chronic kidney disease stage III presents to emergency department due to worsening right lower quadrant pain since last night.  Patient tells me that he has right lower quadrant pain since 2 weeks however started getting worse since last night.  Aggravates with walking and relieved with rest, denies association with food intake, nausea, vomiting, diarrhea, constipation, decreased appetite, weakness lethargy, weight loss, hematemesis or melena.  Had regular bowel movement last night.  Denies headache, blurry vision, chest pain, shortness of breath, palpitation, leg swelling, lightheadedness, dizziness, epigastric burning, history of gallstones, night sweats, urinary symptoms.  He lives with his mom, denies smoking, illicit drug use however drinks alcohol occasionally.  ED Course: Upon arrival to ED: Patient vital signs stable, lipase: Elevated at 279, CBC shows no leukocytosis, UA negative for infection.  CT abdomen/pelvis concerning for cecal diverticulitis.  Patient received Dilaudid/morphine/Zofran in ED.  Review of Systems: As per HPI otherwise negative.    Past Medical History:  Diagnosis Date  . Diabetes mellitus without complication (HCC)    IDDM  . Hypertension   . Shortness of breath dyspnea    with exertion  . Umbilical hernia     Past Surgical History:  Procedure Laterality Date  . CERVICAL FUSION    . ORIF PROXIMAL TIBIAL PLATEAU FRACTURE Left   . UMBILICAL HERNIA REPAIR N/A 07/27/2015   Procedure: HERNIA REPAIR UMBILICAL ADULT;  Surgeon: Donnie Mesa, MD;  Location: [facility];  Service: General;  Laterality: N/A;     reports that he has never smoked. He has never used smokeless tobacco. He reports that he does not drink alcohol or use drugs.  No Known Allergies  No family history on file.  Prior to Admission medications   Medication Sig Start Date End Date Taking? Authorizing Provider  aspirin 81 MG tablet Take 81 mg by mouth daily.   Yes [provider]  atorvastatin (LIPITOR) 40 MG tablet Take 40 mg by mouth daily.   Yes [provider]  carvedilol (COREG) 25 MG tablet Take 50 mg by mouth 2 (two) times daily with a meal.    Yes [provider]  cholecalciferol (VITAMIN D3) 25 MCG (1000 UT) tablet Take 1,000 Units by mouth daily.   Yes [provider]  insulin glargine (LANTUS) 100 UNIT/ML injection Inject 50 Units into the skin at bedtime.    Yes [provider]  lisinopril (PRINIVIL,ZESTRIL) 40 MG tablet Take 40 mg by mouth daily.   Yes [provider]  oxyCODONE-acetaminophen (PERCOCET/ROXICET) 5-325 MG tablet Take 1 tablet by mouth every 4 (four) hours as needed for severe pain. 07/27/15  Yes Donnie Mesa, MD  potassium chloride SA (KLOR-CON) 20 MEQ tablet Take 20 mEq by mouth daily.   Yes [provider]  Semaglutide (OZEMPIC, 0.25 OR 0.5 MG/DOSE, [place]) Inject 0.25 mg into the skin every Monday.   Yes [provider]  vitamin E 400 UNIT capsule Take 400 Units by mouth daily.   Yes [provider]    Physical Exam: Vitals:   02/10/19 1230  02/10/19 1245 02/10/19 1400 02/10/19 1730  BP: 132/77 133/84 110/74 (!) 123/97  Pulse: 85 85 83 86  Resp:    19  Temp:      TempSrc:      SpO2: 96% 97% 95% 94%    Constitutional: NAD, calm, comfortable, morbidly obese Eyes: PERRL, lids and conjunctivae normal ENMT: Mucous membranes are moist. Posterior pharynx clear of any  exudate or lesions.Normal dentition.  Neck: normal, supple, no masses, no thyromegaly Respiratory: clear to auscultation bilaterally, no wheezing, no crackles. Normal respiratory effort. No accessory muscle use.  Cardiovascular: Regular rate and rhythm, no murmurs / rubs / gallops. No extremity edema. 2+ pedal pulses. No carotid bruits.  Abdomen: Obese abdomen, right lower quadrant: Mild tenderness positive, no guarding, no rigidity, no hepatosplenomegaly, bowel sounds positive  musculoskeletal: no clubbing / cyanosis. No joint deformity upper and lower extremities. Good ROM, no contractures. Normal muscle tone.  Skin: no rashes, lesions, ulcers. No induration Neurologic: CN 2-12 grossly intact. Sensation intact, DTR normal. Strength 5/5 in all 4.  Psychiatric: Normal judgment and insight. Alert and oriented x 3. Normal mood.    Labs on Admission: I have personally reviewed following labs and imaging studies  CBC: Recent Labs  Lab 02/10/19 1145  WBC 10.5  HGB 14.2  HCT 43.6  MCV 87.0  PLT 419   Basic Metabolic Panel: Recent Labs  Lab 02/10/19 1145  NA 137  K 4.1  CL 101  CO2 23  GLUCOSE 183*  BUN 19  CREATININE 2.28*  CALCIUM 9.8   GFR: CrCl cannot be calculated (Unknown ideal weight.). Liver Function Tests: Recent Labs  Lab 02/10/19 1145  AST 21  ALT 30  ALKPHOS 64  BILITOT 0.7  PROT 7.1  ALBUMIN 3.7   Recent Labs  Lab 02/10/19 1145  LIPASE 279*   No results for input(s): AMMONIA in the last 168 hours. Coagulation Profile: No results for input(s): INR, PROTIME in the last 168 hours. Cardiac Enzymes: No results for input(s): CKTOTAL, CKMB, CKMBINDEX, TROPONINI in the last 168 hours. BNP (last 3 results) No results for input(s): PROBNP in the last 8760 hours. HbA1C: No results for input(s): HGBA1C in the last 72 hours. CBG: No results for input(s): GLUCAP in the last 168 hours. Lipid Profile: No results for input(s): CHOL, HDL, LDLCALC, TRIG, CHOLHDL,  LDLDIRECT in the last 72 hours. Thyroid Function Tests: No results for input(s): TSH, T4TOTAL, FREET4, T3FREE, THYROIDAB in the last 72 hours. Anemia Panel: No results for input(s): VITAMINB12, FOLATE, FERRITIN, TIBC, IRON, RETICCTPCT in the last 72 hours. Urine analysis:    Component Value Date/Time   COLORURINE YELLOW 02/10/2019 1624   APPEARANCEUR CLEAR 02/10/2019 1624   LABSPEC 1.025 02/10/2019 1624   PHURINE 5.0 02/10/2019 1624   GLUCOSEU NEGATIVE 02/10/2019 1624   GLUCOSEU NEGATIVE 12/30/2005 0906   HGBUR NEGATIVE 02/10/2019 1624   BILIRUBINUR NEGATIVE 02/10/2019 1624   KETONESUR NEGATIVE 02/10/2019 1624   PROTEINUR 30 (A) 02/10/2019 1624   UROBILINOGEN 0.2 mg/dL 12/30/2005 0906   NITRITE NEGATIVE 02/10/2019 1624   LEUKOCYTESUR NEGATIVE 02/10/2019 1624    Radiological Exams on Admission: Ct Abdomen Pelvis Wo Contrast  Result Date: 02/10/2019 CLINICAL DATA:  Abdominal pain, appendicitis suspected EXAM: CT ABDOMEN AND PELVIS WITHOUT CONTRAST TECHNIQUE: Multidetector CT imaging of the abdomen and pelvis was performed following the standard protocol without IV contrast. COMPARISON:  Abdominal ultrasound 02/10/2019 FINDINGS: Lower chest: Lung bases are clear. Normal heart size. No pericardial effusion. Hepatobiliary: No focal liver abnormality  is seen. No gallstones, gallbladder wall thickening, or biliary dilatation. Pancreas: Unremarkable. No pancreatic ductal dilatation or surrounding inflammatory changes. Spleen: Normal in size without focal abnormality. Adrenals/Urinary Tract: Normal adrenal glands. Mild bilateral symmetric perinephric stranding, a nonspecific finding though may correlate with either age or decreased renal function. Kidneys are otherwise unremarkable, without renal calculi, suspicious lesion, or hydronephrosis. Bladder is unremarkable. Stomach/Bowel: Distal esophagus, stomach and duodenal sweep are unremarkable. No small bowel wall thickening or dilatation. No  evidence of obstruction. A normal appendix is visualized. There is focal pericecal inflammation centered upon a culprit diverticulum in the right colon (6/92) containing hyperdense inspissated material, possible fecalith. No extraluminal gas, organized collection or abscess formation is seen. Scattered colonic diverticular present elsewhere throughout the colon without additional sites of inflammation. Vascular/Lymphatic: Atherosclerotic plaque within the normal caliber aorta. Right lower quadrant reactive nodes are present. No pathologically enlarged nodes in the abdomen or pelvis. Reproductive: The prostate and seminal vesicles are unremarkable. Other: Phlegmonous change adjacent the cecum, as above inflammation tracks inferolaterally towards the pelvis. No free air. No free fluid. No organized collection or abscess. No bowel containing hernia. Musculoskeletal: Mild degenerative changes in the spine maximal at L2-3 and L5-S1. No acute osseous abnormality or suspicious osseous lesion. IMPRESSION: 1. Focal pericecal inflammation centered upon a culprit diverticulum in the right colon containing hyperdense inspissated material, possible fecalith. No extraluminal gas, organized collection or abscess formation. Findings are most consistent with acute uncomplicated cecal diverticulitis. 2. Appendix is normal. 3. Mild bilateral symmetric perinephric stranding, a nonspecific finding though may correlate with either age or decreased renal function. 4. Aortic Atherosclerosis (ICD10-I70.0). Electronically Signed   By: Lovena Le M.D.   On: 02/10/2019 18:02   US Abdomen Limited Ruq  Result Date: 02/10/2019 CLINICAL DATA:  Abdominal pain, began yesterday EXAM: ULTRASOUND ABDOMEN LIMITED RIGHT UPPER QUADRANT COMPARISON:  None FINDINGS: Gallbladder: No gallstones or wall thickening visualized. No sonographic Murphy sign noted by sonographer. Common bile duct: Diameter: Difficult to visualize the common bile duct due to  patient body habitus. Diameter of the visualized portion is 4.2 mm, nondilated Liver: No focal lesion identified. Diffusely increased hepatic echogenicity with loss of definition of the portal triads and diminished posterior through transmission compatible with hepatic steatosis. Portal vein is patent on color Doppler imaging with normal direction of blood flow towards the liver. Other: Exam quality degraded due to patient body habitus. IMPRESSION: Technically difficult exam due to patient body habitus with decreased image resolution. Sonographic features of hepatic steatosis. Electronically Signed   By: Lovena Le M.D.   On: 02/10/2019 14:49    Assessment/Plan Principal Problem:   Cecal diverticulitis Active Problems:   DM (diabetes mellitus) ([place])   HLD (hyperlipidemia)   Essential hypertension   CKD (chronic kidney disease), stage III   OSA (obstructive sleep apnea)   Acute cecal diverticulitis: -Patient presented with right lower quadrant pain, CT abdomen/pelvis result as above.  Patient is afebrile, no leukocytosis, UA is negative for infection.  Lipase elevated at 279. -We will admit patient under observation. -Start on gentle hydration, IV Rocephin and Flagyl (cannot start ciprofloxacin due to creatinine of 2.28)-discussed with the pharmacy. -Dilaudid as needed for severe pain and Zofran as needed for nausea and vomiting. -Check lactic acid and blood culture, COVID-19 is pending. -Start on clear liquid diet and will advance as tolerated. -If patient symptoms improves- can go home tomorrow on p.o. antibiotics  Type 2 diabetes mellitus: Check A1c -On Ozempic and Lantus at home. -  Started on sliding scale insulin and monitor blood sugar closely.  Hypertension: Stable -Continue Coreg and lisinopril and monitor blood pressure closely.  Hyperlipidemia: Continue statin  CKD stage IIIb-stable -Continue to monitor, repeat BMP tomorrow a.m.  Obstructive sleep apnea: Noncompliant with  CPAP at home  Morbid obesity: Counseled regarding dietary modification, exercise and weight loss.  DVT prophylaxis: TED/SCD/Lovenox Code Status: Full code Family Communication: None present at bedside.  Plan of care discussed with patient in length and he verbalized understanding and agreed with it. Disposition Plan: Likely home tomorrow Consults called: None Admission status: Observation  Mckinley Jewel MD [facility] Pager [phone]  If 7PM-7AM, please contact night-coverage [email or web address] Password [email or web address]  02/10/2019, 6:48 PM

## 2019-02-10 NOTE — ED Provider Notes (Signed)
[facility] EMERGENCY DEPARTMENT Provider Note   CSN: [id number] Arrival date & time: 02/10/19  1041     History   Chief Complaint Chief Complaint  Patient presents with  . Abdominal Pain    HPI Joseph Conrad is a 63 y.o. male with a past medical history significant for diabetes, hypertension, and hyperlipidemia who presents to the ED due to worsening right lower quadrant pain that has been present for the past week.  Patient notes intermittent right lower quadrant pain x1 week that progressively worsened last night and has become more constant.  Patient rates his pain a 9/10 and describes it as a sharp and stabbing sensation.  Pain is worse with ambulation and any movement.  Patient notes improvement in pain when lying still.  Patient denies associated symptoms of nausea, vomiting, and diarrhea.  Patient had a normal bowel movement yesterday.  Patient denies past abdominal surgeries.  Patient denies recent antibiotics.  Patient has not tried anything for pain prior to hospital arrival.  Patient denies urinary symptoms.  Patient denies chest pain, shortness of breath, melena, hematochezia, and hematemesis.  Past Medical History:  Diagnosis Date  . Diabetes mellitus without complication (HCC)    IDDM  . Hypertension   . Shortness of breath dyspnea    with exertion  . Umbilical hernia     Patient Active Problem List   Diagnosis Date Noted  . ERECTILE DYSFUNCTION 02/12/2007  . UPPER RESPIRATORY INFECTION 02/12/2007  . DIABETES MELLITUS, TYPE II 02/07/2007  . HYPERLIPIDEMIA 02/07/2007  . MORBID OBESITY 02/07/2007  . HYPERTENSION 02/07/2007  . RENAL INSUFFICIENCY, CHRONIC 02/07/2007  . NECK PAIN 02/07/2007    Past Surgical History:  Procedure Laterality Date  . CERVICAL FUSION    . ORIF PROXIMAL TIBIAL PLATEAU FRACTURE Left   . UMBILICAL HERNIA REPAIR N/A 07/27/2015   Procedure: HERNIA REPAIR UMBILICAL ADULT;  Surgeon: Donnie Mesa, MD;  [facility];  Service: General;  Laterality: N/A;        Home Medications    Prior to Admission medications   Medication Sig Start Date End Date Taking? Authorizing Provider  aspirin 81 MG tablet Take 81 mg by mouth daily.    [provider]  atorvastatin (LIPITOR) 40 MG tablet Take 40 mg by mouth daily.    [provider]  carvedilol (COREG) 25 MG tablet Take 25 mg by mouth 2 (two) times daily with a meal. Takes 2 tabs in am and 2 tabs at bedtime    [provider]  doxycycline (VIBRA-TABS) 100 MG tablet Take 100 mg by mouth 2 (two) times daily.    [provider]  insulin glargine (LANTUS) 100 UNIT/ML injection Inject 30 Units into the skin at bedtime.    [provider]  insulin regular (NOVOLIN R,HUMULIN R) 100 units/mL injection Inject 15 Units into the skin 3 (three) times daily before meals.    [provider]  lisinopril (PRINIVIL,ZESTRIL) 40 MG tablet Take 40 mg by mouth daily.    [provider]  metFORMIN (GLUCOPHAGE) 1000 MG tablet Take 1,000 mg by mouth 2 (two) times daily with a meal.    [provider]  oxyCODONE-acetaminophen (PERCOCET/ROXICET) 5-325 MG tablet Take 1 tablet by mouth every 4 (four) hours as needed for severe pain. 07/27/15   Donnie Mesa, MD    Family History No family history on file.  Social History Social History   Tobacco Use  . Smoking status: Never Smoker  .  Smokeless tobacco: Never Used  Substance Use Topics  . Alcohol use: No  . Drug use: No     Allergies   Patient has no known allergies.   Review of Systems Review of Systems  Constitutional: Negative for chills and fever.  Respiratory: Negative for shortness of breath.   Cardiovascular: Negative for chest pain.  Gastrointestinal: Positive for abdominal pain. Negative for abdominal distention, constipation, diarrhea, nausea and vomiting.  Genitourinary: Negative for dysuria and flank pain.  Skin:  Negative for rash.  All other ROS negative    Physical Exam Updated Vital Signs BP 110/74   Pulse 83   Temp 99.5 F (37.5 C) (Oral)   Resp 19   SpO2 95%   Physical Exam Vitals signs and nursing note reviewed.  Constitutional:      General: He is not in acute distress.    Appearance: He is not toxic-appearing.     Comments: Appears uncomfortable in bed.  HENT:     Head: Normocephalic.  Eyes:     Pupils: Pupils are equal, round, and reactive to light.  Neck:     Musculoskeletal: Neck supple.  Cardiovascular:     Rate and Rhythm: Normal rate and regular rhythm.     Pulses: Normal pulses.     Heart sounds: Normal heart sounds. No murmur. No friction rub. No gallop.   Pulmonary:     Effort: Pulmonary effort is normal.     Breath sounds: Normal breath sounds.  Abdominal:     General: Abdomen is flat. Bowel sounds are normal. Possible abdominal distension?    Palpations: Abdomen is soft.     Tenderness: There is abdominal tenderness. There is guarding and rebound.     Comments: RLQ and RUQ tenderness to palpation with guarding. Positive Rovsing sign. Positive Murphy's sign.   Musculoskeletal:     Comments: Able to move all 4 extremities without difficulty. No lower extremity edema.  Skin:    General: Skin is warm and dry.  Neurological:     General: No focal deficit present.     Mental Status: He is alert.      ED Treatments / Results  Labs (all labs ordered are listed, but only abnormal results are displayed) Labs Reviewed  LIPASE, BLOOD - Abnormal; Notable for the following components:      Result Value   Lipase 279 (*)    All other components within normal limits  COMPREHENSIVE METABOLIC PANEL - Abnormal; Notable for the following components:   Glucose, Bld 183 (*)    Creatinine, Ser 2.28 (*)    GFR calc non Af Amer 30 (*)    GFR calc Af Amer 34 (*)    All other components within normal limits  CBC  URINALYSIS, ROUTINE W REFLEX MICROSCOPIC    EKG None   Radiology US Abdomen Limited Ruq  Result Date: 02/10/2019 CLINICAL DATA:  Abdominal pain, began yesterday EXAM: ULTRASOUND ABDOMEN LIMITED RIGHT UPPER QUADRANT COMPARISON:  None FINDINGS: Gallbladder: No gallstones or wall thickening visualized. No sonographic Murphy sign noted by sonographer. Common bile duct: Diameter: Difficult to visualize the common bile duct due to patient body habitus. Diameter of the visualized portion is 4.2 mm, nondilated Liver: No focal lesion identified. Diffusely increased hepatic echogenicity with loss of definition of the portal triads and diminished posterior through transmission compatible with hepatic steatosis. Portal vein is patent on color Doppler imaging with normal direction of blood flow towards the liver. Other: Exam quality degraded due to patient  body habitus. IMPRESSION: Technically difficult exam due to patient body habitus with decreased image resolution. Sonographic features of hepatic steatosis. Electronically Signed   By: Lovena Le M.D.   On: 02/10/2019 14:49    Procedures Procedures (including critical care time)  Medications Ordered in ED Medications  sodium chloride flush (NS) 0.9 % injection 3 mL (3 mLs Intravenous Given 02/10/19 1335)  morphine 4 MG/ML injection 4 mg (4 mg Intravenous Given 02/10/19 1310)     Initial Impression / Assessment and Plan / ED Course  I have reviewed the triage vital signs and the nursing notes.  Pertinent labs & imaging results that were available during my care of the patient were reviewed by me and considered in my medical decision making (see chart for details).       63 year old male presents to the ED due to worsening RLQ pain. Patient is afebrile, not tachycardic or hypoxic. Patient in no acute distress, but appears uncomfortable in bed. Abdomen soft, possible distention? With tenderness in RLQ and RUQ with guarding. Positive Rovsing sign. Positive murphy's sign. Suspect possible appendicitis vs.  Pancreatitis vs. Cholecystitis vs. hepatic etiology. Will start with RUQ [place]. If unremarkable, will add CT abdomen/pelvis.  CMP significant for hyperglycemia at 183, worsening renal function with creatinine at 2.28 and normal BUN at 19. Lipase elevated at 279. CBC reassuring with no leukocytosis. RUQ personally reviewed which demonstrates hepatic steatosis, but was difficult to perform due to body habitus. Given RLQ and RUQ pain, will order CT scan without contrast due to renal function to rule out cholecystitis, appendicitis, and pancreatitis.  Patient handed off to Margarita Mail, PA-C at shift change who will follow-up on CT scan results, reassess patient, and determine disposition. If CT results are normal, consult surgery given possible surgical abdomen.  Discussed case with Dr. Kathrynn Humble who evaluated patient at bedside and agrees with assessment and plan.   Final Clinical Impressions(s) / ED Diagnoses   Final diagnoses:  Abdominal pain    ED Discharge Orders    None       Jonette Eva, PA-C 02/10/19 [place], Ankit, MD 03/02/19 [phone]

## 2019-02-11 DIAGNOSIS — K760 Fatty (change of) liver, not elsewhere classified: Secondary | ICD-10-CM | POA: Diagnosis present

## 2019-02-11 DIAGNOSIS — Z79891 Long term (current) use of opiate analgesic: Secondary | ICD-10-CM | POA: Diagnosis not present

## 2019-02-11 DIAGNOSIS — G4733 Obstructive sleep apnea (adult) (pediatric): Secondary | ICD-10-CM | POA: Diagnosis present

## 2019-02-11 DIAGNOSIS — Z9119 Patient's noncompliance with other medical treatment and regimen: Secondary | ICD-10-CM | POA: Diagnosis not present

## 2019-02-11 DIAGNOSIS — E785 Hyperlipidemia, unspecified: Secondary | ICD-10-CM | POA: Diagnosis present

## 2019-02-11 DIAGNOSIS — Z20828 Contact with and (suspected) exposure to other viral communicable diseases: Secondary | ICD-10-CM | POA: Diagnosis present

## 2019-02-11 DIAGNOSIS — K5732 Diverticulitis of large intestine without perforation or abscess without bleeding: Secondary | ICD-10-CM | POA: Diagnosis present

## 2019-02-11 DIAGNOSIS — R109 Unspecified abdominal pain: Secondary | ICD-10-CM | POA: Diagnosis present

## 2019-02-11 DIAGNOSIS — Z981 Arthrodesis status: Secondary | ICD-10-CM | POA: Diagnosis not present

## 2019-02-11 DIAGNOSIS — Z79899 Other long term (current) drug therapy: Secondary | ICD-10-CM | POA: Diagnosis not present

## 2019-02-11 DIAGNOSIS — Z7982 Long term (current) use of aspirin: Secondary | ICD-10-CM | POA: Diagnosis not present

## 2019-02-11 DIAGNOSIS — N1832 Chronic kidney disease, stage 3b: Secondary | ICD-10-CM | POA: Diagnosis present

## 2019-02-11 DIAGNOSIS — E1122 Type 2 diabetes mellitus with diabetic chronic kidney disease: Secondary | ICD-10-CM | POA: Diagnosis present

## 2019-02-11 DIAGNOSIS — I129 Hypertensive chronic kidney disease with stage 1 through stage 4 chronic kidney disease, or unspecified chronic kidney disease: Secondary | ICD-10-CM | POA: Diagnosis present

## 2019-02-11 DIAGNOSIS — Z794 Long term (current) use of insulin: Secondary | ICD-10-CM | POA: Diagnosis not present

## 2019-02-11 DIAGNOSIS — E1165 Type 2 diabetes mellitus with hyperglycemia: Secondary | ICD-10-CM | POA: Diagnosis present

## 2019-02-11 DIAGNOSIS — Z6841 Body Mass Index (BMI) 40.0 and over, adult: Secondary | ICD-10-CM | POA: Diagnosis not present

## 2019-02-11 LAB — CBC
HCT: 38.1 % — ABNORMAL LOW (ref 39.0–52.0)
Hemoglobin: 12.6 g/dL — ABNORMAL LOW (ref 13.0–17.0)
MCH: 29.2 pg (ref 26.0–34.0)
MCHC: 33.1 g/dL (ref 30.0–36.0)
MCV: 88.2 fL (ref 80.0–100.0)
Platelets: DECREASED 10*3/uL (ref 150–400)
RBC: 4.32 MIL/uL (ref 4.22–5.81)
RDW: 13.3 % (ref 11.5–15.5)
WBC: 8.1 10*3/uL (ref 4.0–10.5)
nRBC: 0 % (ref 0.0–0.2)

## 2019-02-11 LAB — COMPREHENSIVE METABOLIC PANEL
ALT: 21 U/L (ref 0–44)
AST: 15 U/L (ref 15–41)
Albumin: 3 g/dL — ABNORMAL LOW (ref 3.5–5.0)
Alkaline Phosphatase: 50 U/L (ref 38–126)
Anion gap: 11 (ref 5–15)
BUN: 17 mg/dL (ref 8–23)
CO2: 25 mmol/L (ref 22–32)
Calcium: 8.9 mg/dL (ref 8.9–10.3)
Chloride: 102 mmol/L (ref 98–111)
Creatinine, Ser: 2.25 mg/dL — ABNORMAL HIGH (ref 0.61–1.24)
GFR calc Af Amer: 35 mL/min — ABNORMAL LOW (ref >60)
GFR calc non Af Amer: 30 mL/min — ABNORMAL LOW (ref >60)
Glucose, Bld: 113 mg/dL — ABNORMAL HIGH (ref 70–99)
Potassium: 3.7 mmol/L (ref 3.5–5.1)
Sodium: 138 mmol/L (ref 135–145)
Total Bilirubin: 1 mg/dL (ref 0.3–1.2)
Total Protein: 6.3 g/dL — ABNORMAL LOW (ref 6.5–8.1)

## 2019-02-11 LAB — CBG MONITORING, ED: Glucose-Capillary: 118 mg/dL — ABNORMAL HIGH (ref 70–99)

## 2019-02-11 LAB — GLUCOSE, CAPILLARY
Glucose-Capillary: 190 mg/dL — ABNORMAL HIGH (ref 70–99)
Glucose-Capillary: 154 mg/dL — ABNORMAL HIGH (ref 70–99)
Glucose-Capillary: 91 mg/dL (ref 70–99)
Glucose-Capillary: 121 mg/dL — ABNORMAL HIGH (ref 70–99)

## 2019-02-11 NOTE — ED Notes (Signed)
Breakfast Ordered 

## 2019-02-11 NOTE — ED Notes (Signed)
MS 

## 2019-02-11 NOTE — Progress Notes (Signed)
[facility] PROGRESS NOTE  Joseph Conrad WSF:[id number] DOB: 1955-09-23 DOA: 02/10/2019 PCP: [name], [name], DO  Assessment/Plan: Acute cecal diverticulitis: less pain this am. Tolerating clear liquid. CT abdomen/pelvis result with focal pericecal inflammation.  He remains afebrile, no leukocytosis, UA is negative for infection. Provided with IV hydration and IV Rocephin and Flagyl (cannot start ciprofloxacin due to creatinine of 2.28) -continue gentle IV fluids -continue flagyl and rocephin -advance to full liquids in am -Dilaudid as needed for severe pain and Zofran as needed for nausea and vomiting.   Type 2 diabetes mellitus: A1c 6.9. On Ozempic and Lantus at home. -Started on sliding scale insulin and monitor blood sugar closely.  Hypertension: Stable -Continue Coreg and lisinopril and monitor blood pressure closely.  Hyperlipidemia: Continue statin  CKD stage IIIb-stable -Continue to monitor, repeat BMP tomorrow a.m.  Obstructive sleep apnea: Noncompliant with CPAP at home  Morbid obesity: Counseled regarding dietary modification, exercise and weight loss.   Code Status: full Family Communication: patient Disposition Plan: home likely in am   Consultants:    Procedures:    Antibiotics:  Rocephin 12/9>>  Flagyl 12/9>>  HPI/Subjective: Reports less pain and no nausea this am. Tolerating small amounts of clear liquid  Objective: Vitals:   02/11/19 0743 02/11/19 0939  BP: 137/80 109/64  Pulse: 87 84  Resp: 16 18  Temp:  98.2 F (36.8 C)  SpO2: 95% 95%    Intake/Output Summary (Last 24 hours) at 02/11/2019 1158 Last data filed at 02/11/2019 0900 Gross per 24 hour  Intake 200 ml  Output -  Net 200 ml   There were no vitals filed for this visit.  Exam:   General:  Awake alert obese no acute distress  Cardiovascular: rrr no mgr no LE edema  Respiratory: normal effort BS clear bilaterally no wheeze  Abdomen: obese soft +BS  mild tenderness right lower quadrant no guarding or rebounding  Musculoskeletal: joints without swelling/erythema   Data Reviewed: Basic Metabolic Panel: Recent Labs  Lab 02/10/19 1145 02/10/19 1908 02/11/19 0537  NA 137  --  138  K 4.1  --  3.7  CL 101  --  102  CO2 23  --  25  GLUCOSE 183*  --  113*  BUN 19  --  17  CREATININE 2.28* 2.18* 2.25*  CALCIUM 9.8  --  8.9   Liver Function Tests: Recent Labs  Lab 02/10/19 1145 02/11/19 0537  AST 21 15  ALT 30 21  ALKPHOS 64 50  BILITOT 0.7 1.0  PROT 7.1 6.3*  ALBUMIN 3.7 3.0*   Recent Labs  Lab 02/10/19 1145  LIPASE 279*   No results for input(s): AMMONIA in the last 168 hours. CBC: Recent Labs  Lab 02/10/19 1145 02/10/19 1908 02/11/19 0537  WBC 10.5 10.6* 8.1  HGB 14.2 13.5 12.6*  HCT 43.6 41.1 38.1*  MCV 87.0 86.5 88.2  PLT 187 168 PLATELET CLUMPS NOTED ON SMEAR, COUNT APPEARS DECREASED   Cardiac Enzymes: No results for input(s): CKTOTAL, CKMB, CKMBINDEX, TROPONINI in the last 168 hours. BNP (last 3 results) No results for input(s): BNP in the last 8760 hours.  ProBNP (last 3 results) No results for input(s): PROBNP in the last 8760 hours.  CBG: Recent Labs  Lab 02/10/19 2230 02/11/19 0250 02/11/19 0826 02/11/19 1143  GLUCAP 62* 118* 190* 154*    Recent Results (from the past 240 hour(s))  SARS CORONAVIRUS 2 (TAT 6-24 HRS) Nasopharyngeal Nasopharyngeal Swab     Status: None  Collection Time: 02/10/19  7:01 PM   Specimen: Nasopharyngeal Swab  Result Value Ref Range Status   SARS Coronavirus 2 NEGATIVE NEGATIVE Final    Comment: (NOTE) SARS-CoV-2 target nucleic acids are NOT DETECTED. The SARS-CoV-2 RNA is generally detectable in upper and lower respiratory specimens during the acute phase of infection. Negative results do not preclude SARS-CoV-2 infection, do not rule out co-infections with other pathogens, and should not be used as the sole basis for treatment or other patient management  decisions. Negative results must be combined with clinical observations, patient history, and epidemiological information. The expected result is Negative. Fact Sheet for Patients: SugarRoll.be Fact Sheet for Healthcare Providers: https://www.woods-mathews.com/ This test is not yet approved or cleared by the [place] FDA and  has been authorized for detection and/or diagnosis of SARS-CoV-2 by FDA under an Emergency Use Authorization (EUA). This EUA will remain  in effect (meaning this test can be used) for the duration of the COVID-19 declaration under Section 56 4(b)(1) of the Act, 21 U.S.C. section 360bbb-3(b)(1), unless the authorization is terminated or revoked sooner. Performed at [facility], [place] [address] [place], [place] [address]   Culture, blood (routine x 2)     Status: None (Preliminary result)   Collection Time: 02/10/19  7:09 PM   Specimen: BLOOD LEFT HAND  Result Value Ref Range Status   Specimen Description BLOOD LEFT HAND  Final   Special Requests   Final    BOTTLES DRAWN AEROBIC AND ANAEROBIC Blood Culture results may not be optimal due to an inadequate volume of blood received in culture bottles   Culture   Final    NO GROWTH < 24 HOURS Performed at [facility], [place] [address], [place], [place] [address]    Report Status PENDING  Incomplete  Culture, blood (routine x 2)     Status: None (Preliminary result)   Collection Time: 02/10/19  7:10 PM   Specimen: BLOOD RIGHT HAND  Result Value Ref Range Status   Specimen Description BLOOD RIGHT HAND  Final   Special Requests   Final    BOTTLES DRAWN AEROBIC ONLY Blood Culture results may not be optimal due to an inadequate volume of blood received in culture bottles   Culture   Final    NO GROWTH < 24 HOURS Performed at [facility], [place] [address] [place], [place] [address]    Report Status PENDING  Incomplete     Studies: CT ABDOMEN PELVIS WO  CONTRAST  Result Date: 02/10/2019 CLINICAL DATA:  Abdominal pain, appendicitis suspected EXAM: CT ABDOMEN AND PELVIS WITHOUT CONTRAST TECHNIQUE: Multidetector CT imaging of the abdomen and pelvis was performed following the standard protocol without IV contrast. COMPARISON:  Abdominal ultrasound 02/10/2019 FINDINGS: Lower chest: Lung bases are clear. Normal heart size. No pericardial effusion. Hepatobiliary: No focal liver abnormality is seen. No gallstones, gallbladder wall thickening, or biliary dilatation. Pancreas: Unremarkable. No pancreatic ductal dilatation or surrounding inflammatory changes. Spleen: Normal in size without focal abnormality. Adrenals/Urinary Tract: Normal adrenal glands. Mild bilateral symmetric perinephric stranding, a nonspecific finding though may correlate with either age or decreased renal function. Kidneys are otherwise unremarkable, without renal calculi, suspicious lesion, or hydronephrosis. Bladder is unremarkable. Stomach/Bowel: Distal esophagus, stomach and duodenal sweep are unremarkable. No small bowel wall thickening or dilatation. No evidence of obstruction. A normal appendix is visualized. There is focal pericecal inflammation centered upon a culprit diverticulum in the right colon (6/92) containing hyperdense inspissated material,  possible fecalith. No extraluminal gas, organized collection or abscess formation is seen. Scattered colonic diverticular present elsewhere throughout the colon without additional sites of inflammation. Vascular/Lymphatic: Atherosclerotic plaque within the normal caliber aorta. Right lower quadrant reactive nodes are present. No pathologically enlarged nodes in the abdomen or pelvis. Reproductive: The prostate and seminal vesicles are unremarkable. Other: Phlegmonous change adjacent the cecum, as above inflammation tracks inferolaterally towards the pelvis. No free air. No free fluid. No organized collection or abscess. No bowel containing  hernia. Musculoskeletal: Mild degenerative changes in the spine maximal at L2-3 and L5-S1. No acute osseous abnormality or suspicious osseous lesion. IMPRESSION: 1. Focal pericecal inflammation centered upon a culprit diverticulum in the right colon containing hyperdense inspissated material, possible fecalith. No extraluminal gas, organized collection or abscess formation. Findings are most consistent with acute uncomplicated cecal diverticulitis. 2. Appendix is normal. 3. Mild bilateral symmetric perinephric stranding, a nonspecific finding though may correlate with either age or decreased renal function. 4. Aortic Atherosclerosis (ICD10-I70.0). Electronically Signed   By: Lovena Le M.D.   On: 02/10/2019 18:02   US Abdomen Limited RUQ  Result Date: 02/10/2019 CLINICAL DATA:  Abdominal pain, began yesterday EXAM: ULTRASOUND ABDOMEN LIMITED RIGHT UPPER QUADRANT COMPARISON:  None FINDINGS: Gallbladder: No gallstones or wall thickening visualized. No sonographic Murphy sign noted by sonographer. Common bile duct: Diameter: Difficult to visualize the common bile duct due to patient body habitus. Diameter of the visualized portion is 4.2 mm, nondilated Liver: No focal lesion identified. Diffusely increased hepatic echogenicity with loss of definition of the portal triads and diminished posterior through transmission compatible with hepatic steatosis. Portal vein is patent on color Doppler imaging with normal direction of blood flow towards the liver. Other: Exam quality degraded due to patient body habitus. IMPRESSION: Technically difficult exam due to patient body habitus with decreased image resolution. Sonographic features of hepatic steatosis. Electronically Signed   By: Lovena Le M.D.   On: 02/10/2019 14:49    Scheduled Meds: . aspirin EC  81 mg Oral Daily  . atorvastatin  40 mg Oral Daily  . carvedilol  25 mg Oral BID WC  . enoxaparin (LOVENOX) injection  40 mg Subcutaneous Q24H  . insulin aspart   0-15 Units Subcutaneous TID WC  . insulin aspart  0-5 Units Subcutaneous QHS  . lisinopril  40 mg Oral Daily   Continuous Infusions: . sodium chloride 100 mL/hr at 02/11/19 1148  . cefTRIAXone (ROCEPHIN)  IV Stopped (02/11/19 0255)  . metronidazole 500 mg (02/11/19 1151)    Principal Problem:   Cecal diverticulitis Active Problems:   Essential hypertension   DM (diabetes mellitus) (HCC)   CKD (chronic kidney disease), stage III   OSA (obstructive sleep apnea)   HLD (hyperlipidemia)    Time spent: 31 minutes    [place] NP [facility]  If 7PM-7AM, please contact night-coverage at [email or web address], password [facility] 02/11/2019, 11:58 AM  LOS: 0 days

## 2019-02-11 NOTE — ED Notes (Signed)
Attempted report 

## 2019-02-12 DIAGNOSIS — K5732 Diverticulitis of large intestine without perforation or abscess without bleeding: Principal | ICD-10-CM

## 2019-02-12 LAB — LIPASE, BLOOD: Lipase: 39 U/L (ref 11–51)

## 2019-02-12 LAB — BASIC METABOLIC PANEL
Anion gap: 12 (ref 5–15)
BUN: 21 mg/dL (ref 8–23)
CO2: 28 mmol/L (ref 22–32)
Calcium: 8.8 mg/dL — ABNORMAL LOW (ref 8.9–10.3)
Chloride: 100 mmol/L (ref 98–111)
Creatinine, Ser: 2.41 mg/dL — ABNORMAL HIGH (ref 0.61–1.24)
GFR calc Af Amer: 32 mL/min — ABNORMAL LOW (ref >60)
GFR calc non Af Amer: 28 mL/min — ABNORMAL LOW (ref >60)
Glucose, Bld: 97 mg/dL (ref 70–99)
Potassium: 3.6 mmol/L (ref 3.5–5.1)
Sodium: 140 mmol/L (ref 135–145)

## 2019-02-12 LAB — CBC
HCT: 35.8 % — ABNORMAL LOW (ref 39.0–52.0)
Hemoglobin: 11.7 g/dL — ABNORMAL LOW (ref 13.0–17.0)
MCH: 28.7 pg (ref 26.0–34.0)
MCHC: 32.7 g/dL (ref 30.0–36.0)
MCV: 88 fL (ref 80.0–100.0)
Platelets: 143 10*3/uL — ABNORMAL LOW (ref 150–400)
RBC: 4.07 MIL/uL — ABNORMAL LOW (ref 4.22–5.81)
RDW: 13.2 % (ref 11.5–15.5)
WBC: 7.1 10*3/uL (ref 4.0–10.5)
nRBC: 0 % (ref 0.0–0.2)

## 2019-02-12 LAB — GLUCOSE, CAPILLARY
Glucose-Capillary: 99 mg/dL (ref 70–99)
Glucose-Capillary: 109 mg/dL — ABNORMAL HIGH (ref 70–99)
Glucose-Capillary: 114 mg/dL — ABNORMAL HIGH (ref 70–99)
Glucose-Capillary: 88 mg/dL (ref 70–99)

## 2019-02-12 MED ORDER — OXYCODONE-ACETAMINOPHEN 5-325 MG PO TABS
1.0000 | ORAL_TABLET | Freq: Four times a day (QID) | ORAL | Status: DC | PRN
  Administered 2019-02-12 – 2019-02-13 (×2): 1 via ORAL
  Filled 2019-02-12 (×2): qty 1

## 2019-02-12 NOTE — Plan of Care (Signed)
  Problem: Nutrition: Goal: Adequate nutrition will be maintained Outcome: Progressing   

## 2019-02-12 NOTE — Progress Notes (Signed)
PROGRESS NOTE    Joseph Conrad  GEX:[id number] DOB: Jul 24, 1955 DOA: 02/10/2019 PCP: Rosine Abe, DO   Brief Narrative: 63 year old male with hypertension, HLD, DM, morbidly obese/OSA not on CPAP, CKD stage III admitted 12/9 with R LL Q pain x2 wk worse since 12/8. In the ED,lipase: Elevated at 279, CBC shows no leukocytosis, UA negative for infection.  CT abdomen/pelvis concerning for cecal diverticulitis.  Patient received Dilaudid/morphine/Zofran in ED.  Subjective:  Complains of pain in the right lower quadrant and when it is intense it is up to 10 out of 10 and currently 4.  Tolerating liquid diet.   No Fever overnight, WBC at 7.1K. Creatinine worsened to 2.4  Assessment & Plan:   Acute Cecal diverticulitis: Clinically improving still having pain.  We will continue on liquid diet Flagyl and ceftriaxone.  Reports having colonoscopy's at [place].  Possible need for GI follow-up as outpatient.  Pain control with IV and oral opiates.  Add oral oxycodone.  DM on Lantus and Ozempic at home, controlled A1c at 6.9, blood sugar stable, continue sliding scale insulin.  HLD: On a statin.  Essential hypertension: Well-controlled.  His Coreg.  CKD (chronic kidney disease), stage IIIb: Baseline creatinine  2.2 range, was up to 3.1 in March 2018.  Recent Labs  Lab 02/10/19 1145 02/10/19 1908 02/11/19 0537 02/12/19 0533  BUN 19  --  17 21  CREATININE 2.28* 2.18* 2.25* 2.41*    OSA not on CPAP, encourage follow-up with PCP  Morbid Obesity w BMI 40.4: We will need to advise healthy lifestyle and weight loss.  Body mass index is 40.93 kg/m.    DVT prophylaxis:lovenox Code Status: FULL Family Communication: plan of care discussed with patient at bedside. Disposition Plan: Remains inpatient for IV antibiotics for diverticulitis treatment, anticipate discharge in next 1 to 2 days if pain is controlled.  Consultants: NONE Procedures:NONE Microbiology: Blood culture no growth so  far.  COVID-19 PCR negative  Antimicrobials: Anti-infectives (From admission, onward)   Start     Dose/Rate Route Frequency Ordered Stop   02/10/19 1915  cefTRIAXone (ROCEPHIN) 2 g in sodium chloride 0.9 % 100 mL IVPB     2 g 200 mL/hr over 30 Minutes Intravenous Every 24 hours 02/10/19 1903     02/10/19 1900  ciprofloxacin (CIPRO) IVPB 400 mg  Status:  Discontinued     400 mg 200 mL/hr over 60 Minutes Intravenous Every 12 hours 02/10/19 1853 02/10/19 1902   02/10/19 1900  metroNIDAZOLE (FLAGYL) IVPB 500 mg     500 mg 100 mL/hr over 60 Minutes Intravenous Every 8 hours 02/10/19 1853         Objective: Vitals:   02/11/19 1400 02/11/19 1733 02/11/19 2318 02/12/19 0539  BP: 102/64 116/63 108/64 103/66  Pulse:  81 78 76  Resp:  18 18 18   Temp:  98.4 F (36.9 C) 98.4 F (36.9 C) 98.4 F (36.9 C)  TempSrc:  Oral Oral Oral  SpO2:  96% 97% 97%  Weight: 122.1 kg     Height: 5\' 8"  (1.727 m)       Intake/Output Summary (Last 24 hours) at 02/12/2019 0826 Last data filed at 02/12/2019 0606 Gross per 24 hour  Intake 1900.72 ml  Output 300 ml  Net 1600.72 ml   Filed Weights   02/11/19 1400  Weight: 122.1 kg   Weight change:   Body mass index is 40.93 kg/m.  Intake/Output from previous day: 12/10 0701 - 12/11 0700 In: 1900.7 [  I.V.:1475.5; IV Piggyback:425.3] Out: 300 [Urine:300] Intake/Output this shift: No intake/output data recorded.  Examination:  General exam: AAO,NAD, obese, HEENT:Oral mucosa moist, Ear/Nose WNL grossly, dentition normal. Respiratory system: Diminished at the base,no wheezing or crackles,no use of accessory muscle Cardiovascular system: S1 & S2 +, No JVD,. Gastrointestinal system: Abdomen soft, RLQ is tender,ND, BS+ Nervous System:Alert, awake, moving extremities and grossly nonfocal Extremities: No edema, distal peripheral pulses palpable.  Skin: No rashes,no icterus. MSK: Normal muscle bulk,tone, power  Medications:  Scheduled Meds: .  aspirin EC  81 mg Oral Daily  . atorvastatin  40 mg Oral Daily  . carvedilol  25 mg Oral BID WC  . enoxaparin (LOVENOX) injection  40 mg Subcutaneous Q24H  . insulin aspart  0-15 Units Subcutaneous TID WC  . insulin aspart  0-5 Units Subcutaneous QHS   Continuous Infusions: . sodium chloride 50 mL/hr at 02/11/19 1828  . cefTRIAXone (ROCEPHIN)  IV 2 g (02/11/19 1722)  . metronidazole 500 mg (02/12/19 0346)    Data Reviewed: I have personally reviewed following labs and imaging studies  CBC: Recent Labs  Lab 02/10/19 1145 02/10/19 1908 02/11/19 0537 02/12/19 0533  WBC 10.5 10.6* 8.1 7.1  HGB 14.2 13.5 12.6* 11.7*  HCT 43.6 41.1 38.1* 35.8*  MCV 87.0 86.5 88.2 88.0  PLT 187 168 PLATELET CLUMPS NOTED ON SMEAR, COUNT APPEARS DECREASED 811*   Basic Metabolic Panel: Recent Labs  Lab 02/10/19 1145 02/10/19 1908 02/11/19 0537 02/12/19 0533  NA 137  --  138 140  K 4.1  --  3.7 3.6  CL 101  --  102 100  CO2 23  --  25 28  GLUCOSE 183*  --  113* 97  BUN 19  --  17 21  CREATININE 2.28* 2.18* 2.25* 2.41*  CALCIUM 9.8  --  8.9 8.8*   GFR: Estimated Creatinine Clearance: 40.4 mL/min (A) (by C-G formula based on SCr of 2.41 mg/dL (H)). Liver Function Tests: Recent Labs  Lab 02/10/19 1145 02/11/19 0537  AST 21 15  ALT 30 21  ALKPHOS 64 50  BILITOT 0.7 1.0  PROT 7.1 6.3*  ALBUMIN 3.7 3.0*   Recent Labs  Lab 02/10/19 1145 02/12/19 0533  LIPASE 279* 39   No results for input(s): AMMONIA in the last 168 hours. Coagulation Profile: No results for input(s): INR, PROTIME in the last 168 hours. Cardiac Enzymes: No results for input(s): CKTOTAL, CKMB, CKMBINDEX, TROPONINI in the last 168 hours. BNP (last 3 results) No results for input(s): PROBNP in the last 8760 hours. HbA1C: Recent Labs    02/10/19 1910  HGBA1C 6.9*   CBG: Recent Labs  Lab 02/11/19 0826 02/11/19 1143 02/11/19 1613 02/11/19 2109 02/12/19 0616  GLUCAP 190* 154* 91 121* 99   Lipid  Profile: No results for input(s): CHOL, HDL, LDLCALC, TRIG, CHOLHDL, LDLDIRECT in the last 72 hours. Thyroid Function Tests: No results for input(s): TSH, T4TOTAL, FREET4, T3FREE, THYROIDAB in the last 72 hours. Anemia Panel: No results for input(s): VITAMINB12, FOLATE, FERRITIN, TIBC, IRON, RETICCTPCT in the last 72 hours. Sepsis Labs: Recent Labs  Lab 02/10/19 1909 02/10/19 2144  LATICACIDVEN 0.7 0.6    Recent Results (from the past 240 hour(s))  SARS CORONAVIRUS 2 (TAT 6-24 HRS) Nasopharyngeal Nasopharyngeal Swab     Status: None   Collection Time: 02/10/19  7:01 PM   Specimen: Nasopharyngeal Swab  Result Value Ref Range Status   SARS Coronavirus 2 NEGATIVE NEGATIVE Final    Comment: (NOTE) SARS-CoV-2 target  nucleic acids are NOT DETECTED. The SARS-CoV-2 RNA is generally detectable in upper and lower respiratory specimens during the acute phase of infection. Negative results do not preclude SARS-CoV-2 infection, do not rule out co-infections with other pathogens, and should not be used as the sole basis for treatment or other patient management decisions. Negative results must be combined with clinical observations, patient history, and epidemiological information. The expected result is Negative. Fact Sheet for Patients: SugarRoll.be Fact Sheet for Healthcare Providers: https://www.woods-mathews.com/ This test is not yet approved or cleared by the [place] FDA and  has been authorized for detection and/or diagnosis of SARS-CoV-2 by FDA under an Emergency Use Authorization (EUA). This EUA will remain  in effect (meaning this test can be used) for the duration of the COVID-19 declaration under Section 56 4(b)(1) of the Act, 21 U.S.C. section 360bbb-3(b)(1), unless the authorization is terminated or revoked sooner. Performed at [facility], [place] [address] [place], [place] [address]   Culture, blood (routine x 2)      Status: None (Preliminary result)   Collection Time: 02/10/19  7:09 PM   Specimen: BLOOD LEFT HAND  Result Value Ref Range Status   Specimen Description BLOOD LEFT HAND  Final   Special Requests   Final    BOTTLES DRAWN AEROBIC AND ANAEROBIC Blood Culture results may not be optimal due to an inadequate volume of blood received in culture bottles   Culture   Final    NO GROWTH < 24 HOURS Performed at [facility], [place] [address] [place], [place] [address]    Report Status PENDING  Incomplete  Culture, blood (routine x 2)     Status: None (Preliminary result)   Collection Time: 02/10/19  7:10 PM   Specimen: BLOOD RIGHT HAND  Result Value Ref Range Status   Specimen Description BLOOD RIGHT HAND  Final   Special Requests   Final    BOTTLES DRAWN AEROBIC ONLY Blood Culture results may not be optimal due to an inadequate volume of blood received in culture bottles   Culture   Final    NO GROWTH < 24 HOURS Performed at [facility], [place] [address] [place], [place] [address]    Report Status PENDING  Incomplete      Radiology Studies: CT ABDOMEN PELVIS WO CONTRAST  Result Date: 02/10/2019 CLINICAL DATA:  Abdominal pain, appendicitis suspected EXAM: CT ABDOMEN AND PELVIS WITHOUT CONTRAST TECHNIQUE: Multidetector CT imaging of the abdomen and pelvis was performed following the standard protocol without IV contrast. COMPARISON:  Abdominal ultrasound 02/10/2019 FINDINGS: Lower chest: Lung bases are clear. Normal heart size. No pericardial effusion. Hepatobiliary: No focal liver abnormality is seen. No gallstones, gallbladder wall thickening, or biliary dilatation. Pancreas: Unremarkable. No pancreatic ductal dilatation or surrounding inflammatory changes. Spleen: Normal in size without focal abnormality. Adrenals/Urinary Tract: Normal adrenal glands. Mild bilateral symmetric perinephric stranding, a nonspecific finding though may correlate with either age or decreased renal function.  Kidneys are otherwise unremarkable, without renal calculi, suspicious lesion, or hydronephrosis. Bladder is unremarkable. Stomach/Bowel: Distal esophagus, stomach and duodenal sweep are unremarkable. No small bowel wall thickening or dilatation. No evidence of obstruction. A normal appendix is visualized. There is focal pericecal inflammation centered upon a culprit diverticulum in the right colon (6/92) containing hyperdense inspissated material, possible fecalith. No extraluminal gas, organized collection or abscess formation is seen. Scattered colonic diverticular present elsewhere throughout the colon without additional sites of inflammation. Vascular/Lymphatic: Atherosclerotic plaque within the  normal caliber aorta. Right lower quadrant reactive nodes are present. No pathologically enlarged nodes in the abdomen or pelvis. Reproductive: The prostate and seminal vesicles are unremarkable. Other: Phlegmonous change adjacent the cecum, as above inflammation tracks inferolaterally towards the pelvis. No free air. No free fluid. No organized collection or abscess. No bowel containing hernia. Musculoskeletal: Mild degenerative changes in the spine maximal at L2-3 and L5-S1. No acute osseous abnormality or suspicious osseous lesion. IMPRESSION: 1. Focal pericecal inflammation centered upon a culprit diverticulum in the right colon containing hyperdense inspissated material, possible fecalith. No extraluminal gas, organized collection or abscess formation. Findings are most consistent with acute uncomplicated cecal diverticulitis. 2. Appendix is normal. 3. Mild bilateral symmetric perinephric stranding, a nonspecific finding though may correlate with either age or decreased renal function. 4. Aortic Atherosclerosis (ICD10-I70.0). Electronically Signed   By: Lovena Le M.D.   On: 02/10/2019 18:02   US Abdomen Limited RUQ  Result Date: 02/10/2019 CLINICAL DATA:  Abdominal pain, began yesterday EXAM: ULTRASOUND  ABDOMEN LIMITED RIGHT UPPER QUADRANT COMPARISON:  None FINDINGS: Gallbladder: No gallstones or wall thickening visualized. No sonographic Murphy sign noted by sonographer. Common bile duct: Diameter: Difficult to visualize the common bile duct due to patient body habitus. Diameter of the visualized portion is 4.2 mm, nondilated Liver: No focal lesion identified. Diffusely increased hepatic echogenicity with loss of definition of the portal triads and diminished posterior through transmission compatible with hepatic steatosis. Portal vein is patent on color Doppler imaging with normal direction of blood flow towards the liver. Other: Exam quality degraded due to patient body habitus. IMPRESSION: Technically difficult exam due to patient body habitus with decreased image resolution. Sonographic features of hepatic steatosis. Electronically Signed   By: Lovena Le M.D.   On: 02/10/2019 14:49      LOS: 1 day   Time spent: More than 50% of that time was spent in counseling and/or coordination of care.  Antonieta Pert, MD [facility]  02/12/2019, 8:26 AM

## 2019-02-13 LAB — GLUCOSE, CAPILLARY
Glucose-Capillary: 82 mg/dL (ref 70–99)
Glucose-Capillary: 133 mg/dL — ABNORMAL HIGH (ref 70–99)

## 2019-02-13 MED ORDER — METRONIDAZOLE 500 MG PO TABS: 500.0000 mg | ORAL_TABLET | Freq: Three times a day (TID) | ORAL | 0 refills | Status: AC

## 2019-02-13 MED ORDER — INSULIN GLARGINE 100 UNIT/ML ~~LOC~~ SOLN: 10.0000 [IU] | Freq: Every day | SUBCUTANEOUS | 11 refills | Status: DC

## 2019-02-13 MED ORDER — CARVEDILOL 25 MG PO TABS: 12.5000 mg | ORAL_TABLET | Freq: Two times a day (BID) | ORAL | 0 refills | Status: DC

## 2019-02-13 MED ORDER — LISINOPRIL 20 MG PO TABS: 20.0000 mg | ORAL_TABLET | Freq: Every day | ORAL | 0 refills | Status: DC

## 2019-02-13 MED ORDER — AMOXICILLIN-POT CLAVULANATE 875-125 MG PO TABS: 1.0000 | ORAL_TABLET | Freq: Two times a day (BID) | ORAL | 0 refills | Status: AC

## 2019-02-13 NOTE — Progress Notes (Signed)
Discharge instruction reviewed with patient. All questions answered at this time. Transport home by family.   Jouri Threat, RN 

## 2019-02-13 NOTE — Discharge Summary (Signed)
Physician Discharge Summary  Patient ID: Joseph Conrad MRN: [id number] DOB/AGE: 11-22-1955 63 y.o.  Admit date: 02/10/2019 Discharge date: 02/13/2019  Admission Diagnoses:  Discharge Diagnoses:  Principal Problem:   Cecal diverticulitis Active Problems:   DM (diabetes mellitus) ([place])   HLD (hyperlipidemia)   Essential hypertension   CKD (chronic kidney disease), stage III   Diverticulitis   OSA (obstructive sleep apnea)   Discharged Condition: stable  Hospital Course: Patient is a 63 year old African-American male with past medical history significant for hypertension, HLD, DM, morbidly obese/OSA not on CPAP and chronic kidney disease stage III/IV.  Patient was admitted with 2-week history of right lower quadrant abdominal pain.  CT scan of the abdomen and pelvis done on presentation was concerning for sigmoid diverticulitis.  Patient was admitted and managed for sigmoid diverticulitis with IV antibiotics.  Abdominal pain has resolved.  Patient will be discharged back home on oral antibiotics.  Primary care provider will follow patient within 1 week.  Primary care provider will also kindly monitor renal function and electrolytes considering that patient is on ACE inhibitor, with moderate to advanced chronic kidney disease.  Patient is eager to be discharged back today.    Acute Cecal diverticulitis:  Patient was admitted and managed with IV antibiotics as documented above.  Patient symptoms have resolved.  Patient will be discharged back home on oral antibiotics.  Patient will follow with a primary care provider and the GI team within 1 week of discharge.  Patient's last colonoscopy was 5 years ago.  There may be need to repeat colonoscopy in the near future.   DM: -Patient was on significant amount of subcutaneous Lantus at home, as well as, Ozempic.  -Patient has only required 6 units of sliding scale insulin over the last 24 hours, with subcutaneous Lantus on Ozempic being held.    -We will decrease patient's subcutaneous Lantus to 10 units nightly.  Will hold Ozempic on discharge.  Primary care provider will kindly monitor patient's blood sugar on discharge and adjust insulin regimen accordingly.   -Hemoglobin A1c on presentation was 6.9%.    Hyperlipidemia:  Continue statin.    Essential hypertension:  Continue to monitor closely on discharge.    CKD (chronic kidney disease), stage IIIb/4:  -Renal function is at baseline.   -Patient is still on ACE inhibitor.   -Monitor renal function and electrolytes closely.    OSA: Not on CPAP.   Follow with PCP.    Morbid Obesity w BMI 40.4:               Further management of morbid obesity on outpatient basis.   Consults: None  Significant Diagnostic Studies: CT abdomen and pelvis revealed findings concerning for surgical diverticulitis.  Discharge Exam: Blood pressure 127/87, pulse 67, temperature 98.5 F (36.9 C), temperature source Oral, resp. rate 18, height 5\' 8"  (1.727 m), weight 122.1 kg, SpO2 97 %.  Disposition: Discharge disposition: 01-Home or Self Care   Discharge Instructions    Diet - low sodium heart healthy   Complete by: As directed    Diet Carb Modified   Complete by: As directed    Increase activity slowly   Complete by: As directed      Allergies as of 02/13/2019   No Known Allergies     Medication List    STOP taking these medications   OZEMPIC (0.25 OR 0.5 MG/DOSE) [place]   potassium chloride SA 20 MEQ tablet Commonly known as: KLOR-CON  TAKE these medications   amoxicillin-clavulanate 875-125 MG tablet Commonly known as: Augmentin Take 1 tablet by mouth 2 (two) times daily for 7 days.   aspirin 81 MG tablet Take 81 mg by mouth daily.   atorvastatin 40 MG tablet Commonly known as: LIPITOR Take 40 mg by mouth daily.   carvedilol 25 MG tablet Commonly known as: COREG Take 0.5 tablets (12.5 mg total) by mouth 2 (two) times daily with a meal. What changed: how  much to take   cholecalciferol 25 MCG (1000 UT) tablet Commonly known as: VITAMIN D3 Take 1,000 Units by mouth daily.   insulin glargine 100 UNIT/ML injection Commonly known as: LANTUS Inject 0.1 mLs (10 Units total) into the skin at bedtime. What changed: how much to take   lisinopril 20 MG tablet Commonly known as: ZESTRIL Take 1 tablet (20 mg total) by mouth daily. What changed:   medication strength  how much to take   metroNIDAZOLE 500 MG tablet Commonly known as: Flagyl Take 1 tablet (500 mg total) by mouth 3 (three) times daily for 7 days.   oxyCODONE-acetaminophen 5-325 MG tablet Commonly known as: PERCOCET/ROXICET Take 1 tablet by mouth every 4 (four) hours as needed for severe pain.   vitamin E 400 UNIT capsule Take 400 Units by mouth daily.        SignedBonnell Public 02/13/2019, 3:08 PM

## 2019-02-15 LAB — CULTURE, BLOOD (ROUTINE X 2)
Culture: NO GROWTH
Culture: NO GROWTH

## 2019-07-20 ENCOUNTER — Encounter (INDEPENDENT_AMBULATORY_CARE_PROVIDER_SITE_OTHER): Payer: Self-pay | Admitting: *Deleted

## 2019-09-15 ENCOUNTER — Other Ambulatory Visit (INDEPENDENT_AMBULATORY_CARE_PROVIDER_SITE_OTHER): Payer: Self-pay | Admitting: *Deleted

## 2019-09-16 ENCOUNTER — Other Ambulatory Visit (INDEPENDENT_AMBULATORY_CARE_PROVIDER_SITE_OTHER): Payer: Self-pay | Admitting: *Deleted

## 2019-09-16 DIAGNOSIS — Z86010 Personal history of colonic polyps: Secondary | ICD-10-CM

## 2019-10-05 ENCOUNTER — Encounter (INDEPENDENT_AMBULATORY_CARE_PROVIDER_SITE_OTHER): Payer: Self-pay | Admitting: *Deleted

## 2019-11-01 ENCOUNTER — Telehealth (INDEPENDENT_AMBULATORY_CARE_PROVIDER_SITE_OTHER): Payer: Self-pay | Admitting: *Deleted

## 2019-11-01 NOTE — Telephone Encounter (Signed)
Referring MD/PCP: green   Procedure: tcs  Reason/Indication:  Hx polyps  Has patient had this procedure before?  Yes, 5 yrs ago  If so, when, by whom and where?    Is there a family history of colon cancer?   no  Who?  What age when diagnosed?    Is patient diabetic?   yes      Does patient have prosthetic heart valve or mechanical valve?  no  Do you have a pacemaker/defibrillator?  no  Has patient ever had endocarditis/atrial fibrillation? no  Does patient use oxygen? no  Has patient had joint replacement within last 12 months?  no  Is patient constipated or do they take laxatives? no  Does patient have a history of alcohol/drug use?  no  Is patient on blood thinner such as Coumadin, Plavix and/or Aspirin? yes  Medications: asa 81 mg daily, carvedilol daily, amlodipine daily, iron daily  Allergies:   Medication Adjustment per Dr Rehman/Dr Jenetta Downer asa 2 days, iron 10 days  Procedure date & time: 12/02/19

## 2019-11-30 ENCOUNTER — Other Ambulatory Visit: Payer: Self-pay

## 2019-11-30 ENCOUNTER — Other Ambulatory Visit (HOSPITAL_COMMUNITY)
Admission: RE | Admit: 2019-11-30 | Discharge: 2019-11-30 | Disposition: A | Discharge status: Home / Self Care | Payer: No Typology Code available for payment source | Source: Ambulatory Visit | Attending: Internal Medicine | Admitting: Internal Medicine

## 2019-11-30 DIAGNOSIS — Z20822 Contact with and (suspected) exposure to covid-19: Secondary | ICD-10-CM | POA: Insufficient documentation

## 2019-11-30 DIAGNOSIS — Z01812 Encounter for preprocedural laboratory examination: Secondary | ICD-10-CM | POA: Diagnosis present

## 2019-11-30 LAB — SARS CORONAVIRUS 2 (TAT 6-24 HRS): SARS Coronavirus 2: NEGATIVE

## 2019-12-02 ENCOUNTER — Encounter (HOSPITAL_COMMUNITY): Payer: Self-pay | Admitting: Internal Medicine

## 2019-12-02 ENCOUNTER — Other Ambulatory Visit: Payer: Self-pay

## 2019-12-02 ENCOUNTER — Encounter (HOSPITAL_COMMUNITY)
Admission: RE | Disposition: A | Discharge status: Home / Self Care | Payer: Self-pay | Source: Home / Self Care | Attending: Internal Medicine

## 2019-12-02 ENCOUNTER — Ambulatory Visit (HOSPITAL_COMMUNITY)
Admission: RE | Admit: 2019-12-02 | Discharge: 2019-12-02 | Disposition: A | Discharge status: Home / Self Care | Payer: No Typology Code available for payment source | Attending: Internal Medicine | Admitting: Internal Medicine

## 2019-12-02 DIAGNOSIS — K5730 Diverticulosis of large intestine without perforation or abscess without bleeding: Secondary | ICD-10-CM

## 2019-12-02 DIAGNOSIS — I10 Essential (primary) hypertension: Secondary | ICD-10-CM | POA: Diagnosis not present

## 2019-12-02 DIAGNOSIS — Z7982 Long term (current) use of aspirin: Secondary | ICD-10-CM | POA: Insufficient documentation

## 2019-12-02 DIAGNOSIS — E119 Type 2 diabetes mellitus without complications: Secondary | ICD-10-CM | POA: Diagnosis not present

## 2019-12-02 DIAGNOSIS — K648 Other hemorrhoids: Secondary | ICD-10-CM | POA: Insufficient documentation

## 2019-12-02 DIAGNOSIS — D122 Benign neoplasm of ascending colon: Secondary | ICD-10-CM | POA: Insufficient documentation

## 2019-12-02 DIAGNOSIS — Z86010 Personal history of colonic polyps: Secondary | ICD-10-CM | POA: Diagnosis not present

## 2019-12-02 DIAGNOSIS — Z79899 Other long term (current) drug therapy: Secondary | ICD-10-CM | POA: Insufficient documentation

## 2019-12-02 DIAGNOSIS — Z794 Long term (current) use of insulin: Secondary | ICD-10-CM | POA: Insufficient documentation

## 2019-12-02 DIAGNOSIS — Z1211 Encounter for screening for malignant neoplasm of colon: Secondary | ICD-10-CM | POA: Insufficient documentation

## 2019-12-02 HISTORY — PX: COLONOSCOPY: SHX5424

## 2019-12-02 HISTORY — PX: BIOPSY: SHX5522

## 2019-12-02 LAB — GLUCOSE, CAPILLARY
Glucose-Capillary: 38 mg/dL — CL (ref 70–99)
Glucose-Capillary: 165 mg/dL — ABNORMAL HIGH (ref 70–99)
Glucose-Capillary: 59 mg/dL — ABNORMAL LOW (ref 70–99)
Glucose-Capillary: 142 mg/dL — ABNORMAL HIGH (ref 70–99)

## 2019-12-02 LAB — HM COLONOSCOPY

## 2019-12-02 SURGERY — COLONOSCOPY
Anesthesia: Moderate Sedation

## 2019-12-02 MED ORDER — SODIUM CHLORIDE FLUSH 0.9 % IV SOLN
INTRAVENOUS | Status: AC
  Filled 2019-12-02: qty 10

## 2019-12-02 MED ORDER — MIDAZOLAM HCL 5 MG/5ML IJ SOLN
INTRAMUSCULAR | Status: DC | PRN
  Administered 2019-12-02 (×4): 2 mg via INTRAVENOUS

## 2019-12-02 MED ORDER — SODIUM CHLORIDE 0.9 % IV SOLN: INTRAVENOUS | Status: DC

## 2019-12-02 MED ORDER — GLUCAGON HCL RDNA (DIAGNOSTIC) 1 MG IJ SOLR
INTRAMUSCULAR | Status: AC
  Administered 2019-12-02: 1 mg via INTRAVENOUS
  Filled 2019-12-02: qty 1

## 2019-12-02 MED ORDER — STERILE WATER FOR IRRIGATION IR SOLN
Status: DC | PRN
  Administered 2019-12-02: 100 mL

## 2019-12-02 MED ORDER — DEXTROSE-NACL 5-0.9 % IV SOLN: INTRAVENOUS | Status: DC

## 2019-12-02 MED ORDER — GLUCAGON HCL (RDNA) 1 MG IJ SOLR
1.0000 mg | Freq: Once | INTRAMUSCULAR | Status: AC | PRN
  Filled 2019-12-02: qty 1

## 2019-12-02 MED ORDER — MEPERIDINE HCL 50 MG/ML IJ SOLN
INTRAMUSCULAR | Status: AC
  Filled 2019-12-02: qty 1

## 2019-12-02 MED ORDER — MEPERIDINE HCL 50 MG/ML IJ SOLN
INTRAMUSCULAR | Status: DC | PRN
Start: 2019-12-02 — End: 2019-12-02
  Administered 2019-12-02 (×2): 25 mg

## 2019-12-02 MED ORDER — DEXTROSE 50 % IV SOLN
INTRAVENOUS | Status: AC
  Administered 2019-12-02: 25 g via INTRAVENOUS
  Filled 2019-12-02: qty 50

## 2019-12-02 MED ORDER — DEXTROSE 50 % IV SOLN: 25.0000 g | Freq: Once | INTRAVENOUS | Status: AC

## 2019-12-02 MED ORDER — MIDAZOLAM HCL 5 MG/5ML IJ SOLN
INTRAMUSCULAR | Status: AC
  Filled 2019-12-02: qty 10

## 2019-12-02 MED ORDER — DEXTROSE 50 % IV SOLN
INTRAVENOUS | Status: AC
  Filled 2019-12-02: qty 50

## 2019-12-02 NOTE — Discharge Instructions (Signed)
Resume aspirin on 12/03/2019 Resume other medications as before. Modified carb high-fiber diet. No driving for 24 hours. Physician will call with biopsy results. Next colonoscopy in 5 years.       Colonoscopy, Adult, Care After This sheet gives you information about how to care for yourself after your procedure. Your doctor may also give you more specific instructions. If you have problems or questions, call your doctor. What can I expect after the procedure? After the procedure, it is common to have:  A small amount of blood in your poop (stool) for 24 hours.  Some gas.  Mild cramping or bloating in your belly (abdomen). Follow these instructions at home: Eating and drinking   Drink enough fluid to keep your pee (urine) pale yellow.  Follow instructions from your doctor about what you cannot eat or drink.  Return to your normal diet as told by your doctor. Avoid heavy or fried foods that are hard to digest. Activity  Rest as told by your doctor.  Do not sit for a long time without moving. Get up to take short walks every 1-2 hours. This is important. Ask for help if you feel weak or unsteady.  Return to your normal activities as told by your doctor. Ask your doctor what activities are safe for you. To help cramping and bloating:   Try walking around.  Put heat on your belly as told by your doctor. Use the heat source that your doctor recommends, such as a moist heat pack or a heating pad. ? Put a towel between your skin and the heat source. ? Leave the heat on for 20-30 minutes. ? Remove the heat if your skin turns bright red. This is very important if you are unable to feel pain, heat, or cold. You may have a greater risk of getting burned. General instructions  For the first 24 hours after the procedure: ? Do not drive or use machinery. ? Do not sign important documents. ? Do not drink alcohol. ? Do your daily activities more slowly than normal. ? Eat foods  that are soft and easy to digest.  Take over-the-counter or prescription medicines only as told by your doctor.  Keep all follow-up visits as told by your doctor. This is important. Contact a doctor if:  You have blood in your poop 2-3 days after the procedure. Get help right away if:  You have more than a small amount of blood in your poop.  You see large clumps of tissue (blood clots) in your poop.  Your belly is swollen.  You feel like you may vomit (nauseous).  You vomit.  You have a fever.  You have belly pain that gets worse, and medicine does not help your pain. Summary  After the procedure, it is common to have a small amount of blood in your poop. You may also have mild cramping and bloating in your belly.  For the first 24 hours after the procedure, do not drive or use machinery, do not sign important documents, and do not drink alcohol.  Get help right away if you have a lot of blood in your poop, feel like you may vomit, have a fever, or have more belly pain. This information is not intended to replace advice given to you by your health care provider. Make sure you discuss any questions you have with your health care provider. Document Revised: 09/14/2018 Document Reviewed: 09/14/2018 Elsevier Patient Education  [place].    Colon Polyps  Polyps are tissue growths inside the body. Polyps can grow in many places, including the large intestine (colon). A polyp may be a round bump or a mushroom-shaped growth. You could have one polyp or several. Most colon polyps are noncancerous (benign). However, some colon polyps can become cancerous over time. Finding and removing the polyps early can help prevent this. What are the causes? The exact cause of colon polyps is not known. What increases the risk? You are more likely to develop this condition if you:  Have a family history of colon cancer or colon polyps.  Are older than 39 or older than 45 if you are  African American.  Have inflammatory bowel disease, such as ulcerative colitis or Crohn's disease.  Have certain hereditary conditions, such as: ? Familial adenomatous polyposis. ? Lynch syndrome. ? Turcot syndrome. ? Peutz-Jeghers syndrome.  Are overweight.  Smoke cigarettes.  Do not get enough exercise.  Drink too much alcohol.  Eat a diet that is high in fat and red meat and low in fiber.  Had childhood cancer that was treated with abdominal radiation. What are the signs or symptoms? Most polyps do not cause symptoms. If you have symptoms, they may include:  Blood coming from your rectum when having a bowel movement.  Blood in your stool. The stool may look dark red or black.  Abdominal pain.  A change in bowel habits, such as constipation or diarrhea. How is this diagnosed? This condition is diagnosed with a colonoscopy. This is a procedure in which a lighted, flexible scope is inserted into the anus and then passed into the colon to examine the area. Polyps are sometimes found when a colonoscopy is done as part of routine cancer screening tests. How is this treated? Treatment for this condition involves removing any polyps that are found. Most polyps can be removed during a colonoscopy. Those polyps will then be tested for cancer. Additional treatment may be needed depending on the results of testing. Follow these instructions at home: Lifestyle  Maintain a healthy weight, or lose weight if recommended by your health care provider.  Exercise every day or as told by your health care provider.  Do not use any products that contain nicotine or tobacco, such as cigarettes and e-cigarettes. If you need help quitting, ask your health care provider.  If you drink alcohol, limit how much you have: ? 0-1 drink a day for women. ? 0-2 drinks a day for men.  Be aware of how much alcohol is in your drink. In the [place] one drink equals one 12 oz bottle of beer (355 mL), one 5  oz glass of wine (148 mL), or one 1 oz shot of hard liquor (44 mL). Eating and drinking   Eat foods that are high in fiber, such as fruits, vegetables, and whole grains.  Eat foods that are high in calcium and vitamin D, such as milk, cheese, yogurt, eggs, liver, fish, and broccoli.  Limit foods that are high in fat, such as fried foods and desserts.  Limit the amount of red meat and processed meat you eat, such as hot dogs, sausage, bacon, and lunch meats. General instructions  Keep all follow-up visits as told by your health care provider. This is important. ? This includes having regularly scheduled colonoscopies. ? Talk to your health care provider about when you need a colonoscopy. Contact a health care provider if:  You have new or worsening bleeding during a bowel movement.  You have new  or increased blood in your stool.  You have a change in bowel habits.  You lose weight for no known reason. Summary  Polyps are tissue growths inside the body. Polyps can grow in many places, including the colon.  Most colon polyps are noncancerous (benign), but some can become cancerous over time.  This condition is diagnosed with a colonoscopy.  Treatment for this condition involves removing any polyps that are found. Most polyps can be removed during a colonoscopy. This information is not intended to replace advice given to you by your health care provider. Make sure you discuss any questions you have with your health care provider. Document Revised: 06/05/2017 Document Reviewed: 06/05/2017 Elsevier Patient Education  [place].    High-Fiber Diet Fiber, also called dietary fiber, is a type of carbohydrate that is found in fruits, vegetables, whole grains, and beans. A high-fiber diet can have many health benefits. Your health care provider may recommend a high-fiber diet to help:  Prevent constipation. Fiber can make your bowel movements more regular.  Lower your  cholesterol.  Relieve the following conditions: ? Swelling of veins in the anus (hemorrhoids). ? Swelling and irritation (inflammation) of specific areas of the digestive tract (uncomplicated diverticulosis). ? A problem of the large intestine (colon) that sometimes causes pain and diarrhea (irritable bowel syndrome, IBS).  Prevent overeating as part of a weight-loss plan.  Prevent heart disease, type 2 diabetes, and certain cancers. What is my plan? The recommended daily fiber intake in grams (g) includes:  38 g for men age 33 or younger.  30 g for men over age 86.  63 g for women age 88 or younger.  21 g for women over age 60. You can get the recommended daily intake of dietary fiber by:  Eating a variety of fruits, vegetables, grains, and beans.  Taking a fiber supplement, if it is not possible to get enough fiber through your diet. What do I need to know about a high-fiber diet?  It is better to get fiber through food sources rather than from fiber supplements. There is not a lot of research about how effective supplements are.  Always check the fiber content on the nutrition facts label of any prepackaged food. Look for foods that contain 5 g of fiber or more per serving.  Talk with a diet and nutrition specialist (dietitian) if you have questions about specific foods that are recommended or not recommended for your medical condition, especially if those foods are not listed below.  Gradually increase how much fiber you consume. If you increase your intake of dietary fiber too quickly, you may have bloating, cramping, or gas.  Drink plenty of water. Water helps you to digest fiber. What are tips for following this plan?  Eat a wide variety of high-fiber foods.  Make sure that half of the grains that you eat each day are whole grains.  Eat breads and cereals that are made with whole-grain flour instead of refined flour or white flour.  Eat brown rice, bulgur wheat, or  millet instead of white rice.  Start the day with a breakfast that is high in fiber, such as a cereal that contains 5 g of fiber or more per serving.  Use beans in place of meat in soups, salads, and pasta dishes.  Eat high-fiber snacks, such as berries, raw vegetables, nuts, and popcorn.  Choose whole fruits and vegetables instead of processed forms like juice or sauce. What foods can I eat?  Fruits Berries. Pears. Apples. Oranges. Avocado. Prunes and raisins. Dried figs. Vegetables Sweet potatoes. Spinach. Kale. Artichokes. Cabbage. Broccoli. Cauliflower. Green peas. Carrots. Squash. Grains Whole-grain breads. Multigrain cereal. Oats and oatmeal. Brown rice. Barley. Bulgur wheat. [place]. Quinoa. Bran muffins. Popcorn. Rye wafer crackers. Meats and other proteins Navy, kidney, and pinto beans. Soybeans. Split peas. Lentils. Nuts and seeds. Dairy Fiber-fortified yogurt. Beverages Fiber-fortified soy milk. Fiber-fortified orange juice. Other foods Fiber bars. The items listed above may not be a complete list of recommended foods and beverages. Contact a dietitian for more options. What foods are not recommended? Fruits Fruit juice. Cooked, strained fruit. Vegetables Fried potatoes. Canned vegetables. Well-cooked vegetables. Grains White bread. Pasta made with refined flour. White rice. Meats and other proteins Fatty cuts of meat. Fried chicken or fried fish. Dairy Milk. Yogurt. Cream cheese. Sour cream. Fats and oils Butters. Beverages Soft drinks. Other foods Cakes and pastries. The items listed above may not be a complete list of foods and beverages to avoid. Contact a dietitian for more information. Summary  Fiber is a type of carbohydrate. It is found in fruits, vegetables, whole grains, and beans.  There are many health benefits of eating a high-fiber diet, such as preventing constipation, lowering blood cholesterol, helping with weight loss, and reducing your risk  of heart disease, diabetes, and certain cancers.  Gradually increase your intake of fiber. Increasing too fast can result in cramping, bloating, and gas. Drink plenty of water while you increase your fiber.  The best sources of fiber include whole fruits and vegetables, whole grains, nuts, seeds, and beans. This information is not intended to replace advice given to you by your health care provider. Make sure you discuss any questions you have with your health care provider. Document Revised: 12/23/2016 Document Reviewed: 12/23/2016 Elsevier Patient Education  2020 [employer].

## 2019-12-02 NOTE — Progress Notes (Signed)
Patient sitting up in recliner , alert and oriented x 4 after completing full breakfast meal of 2 hard boiled eggs, 2 strips of bacon, baked apples.

## 2019-12-02 NOTE — H&P (Signed)
Joseph Conrad is an 64 y.o. male.   Chief Complaint: Patient is here for colonoscopy. HPI: Patient is 64 year old African-American male who has history of colonic adenomas and is here for surveillance colonoscopy.  Last 2 exams are at [facility].  Last exam was 5 years ago.  He denies abdominal pain change in bowel habits or rectal bleeding. Family history is negative for CRC.  Past Medical History:  Diagnosis Date  . Diabetes mellitus without complication (HCC)    IDDM  . Hypertension   . Shortness of breath dyspnea    with exertion  . Umbilical hernia     Past Surgical History:  Procedure Laterality Date  . CERVICAL FUSION    . ORIF PROXIMAL TIBIAL PLATEAU FRACTURE Left   . UMBILICAL HERNIA REPAIR N/A 07/27/2015   Procedure: HERNIA REPAIR UMBILICAL ADULT;  Surgeon: Donnie Mesa, MD;  Location: [facility];  Service: General;  Laterality: N/A;    History reviewed. No pertinent family history. Social History:  reports that he has never smoked. He has never used smokeless tobacco. He reports that he does not drink alcohol and does not use drugs.  Allergies: No Known Allergies  Medications Prior to Admission  Medication Sig Dispense Refill  . amLODipine (NORVASC) 10 MG tablet Take 10 mg by mouth daily.    Marland Kitchen aspirin 81 MG tablet Take 81 mg by mouth daily.    . carvedilol (COREG) 25 MG tablet Take 0.5 tablets (12.5 mg total) by mouth 2 (two) times daily with a meal. 60 tablet 0  . Cholecalciferol (VITAMIN D) 50 MCG (2000 UT) CAPS Take 2,000 Units by mouth daily.     . insulin aspart protamine- aspart (NOVOLOG MIX 70/30) (70-30) 100 UNIT/ML injection Inject 50 Units into the skin 2 (two) times daily with a meal.    . atorvastatin (LIPITOR) 40 MG tablet Take 40 mg by mouth daily. (Patient not taking: Reported on 11/23/2019)    . ferrous sulfate 325 (65 FE) MG tablet Take 325 mg by mouth every 3 (three) days.    . insulin glargine (LANTUS) 100 UNIT/ML  injection Inject 0.1 mLs (10 Units total) into the skin at bedtime. (Patient not taking: Reported on 11/23/2019) 10 mL 11  . lisinopril (ZESTRIL) 20 MG tablet Take 1 tablet (20 mg total) by mouth daily. (Patient not taking: Reported on 11/23/2019) 30 tablet 0  . oxyCODONE-acetaminophen (PERCOCET/ROXICET) 5-325 MG tablet Take 1 tablet by mouth every 4 (four) hours as needed for severe pain. (Patient not taking: Reported on 11/23/2019) 40 tablet 0  . vitamin E 400 UNIT capsule Take 400 Units by mouth daily. (Patient not taking: Reported on 11/23/2019)      Results for orders placed or performed during the hospital encounter of 12/02/19 (from the past 48 hour(s))  Glucose, capillary     Status: Abnormal   Collection Time: 12/02/19  7:20 AM  Result Value Ref Range   Glucose-Capillary 38 (LL) 70 - 99 mg/dL    Comment: Glucose reference range applies only to samples taken after fasting for at least 8 hours.  Glucose, capillary     Status: Abnormal   Collection Time: 12/02/19  7:34 AM  Result Value Ref Range   Glucose-Capillary 165 (H) 70 - 99 mg/dL    Comment: Glucose reference range applies only to samples taken after fasting for at least 8 hours.   No results found.  Review of Systems  Blood pressure (!) 147/84, pulse 66, temperature  98.1 F (36.7 C), temperature source Oral, resp. rate 15, height 5\' 8"  (1.727 m), weight 113.4 kg, SpO2 100 %. Physical Exam HENT:     Mouth/Throat:     Mouth: Mucous membranes are moist.     Pharynx: Oropharynx is clear.  Eyes:     General: No scleral icterus.    Conjunctiva/sclera: Conjunctivae normal.  Cardiovascular:     Rate and Rhythm: Normal rate and regular rhythm.     Heart sounds: Normal heart sounds. No murmur heard.   Pulmonary:     Effort: Pulmonary effort is normal.     Breath sounds: Normal breath sounds.  Abdominal:     Comments: Abdomen is protuberant but soft and nontender with organomegaly or masses.  Musculoskeletal:        General:  No swelling.     Cervical back: Neck supple.  Lymphadenopathy:     Cervical: No cervical adenopathy.  Skin:    General: Skin is warm and dry.  Neurological:     Mental Status: He is alert.      Assessment/Plan History of colonic adenomas. Surveillance colonoscopy.  Hildred Laser, MD 12/02/2019, 8:35 AM

## 2019-12-02 NOTE — OR Nursing (Signed)
Pt ambulated independently to Endoscopy pre-op room with this nurse. Upon arrival and inquiring about medications and allergies, patient states he took took 50 Units of his insulin this morning. Pt is alert, communicating appropriately, states he feels weak, and pale skin color for ethnicity. Checked blood glucose FSBG 38. Manuela Schwartz, RN assisted patient from chair to bed. Informed Dr. Laural Golden of result and patients symptoms. Orders received. After starting IV, medications given. Within a few minutes, patient states he feels better. Pt no longer pale. Rechecked FSBG at 0734, FSBG 165. Dr. Laural Golden aware.

## 2019-12-02 NOTE — Op Note (Signed)
[facility] Patient Name: Joseph Conrad Procedure Date: 12/02/2019 8:17 AM MRN: [id number] Date of Birth: 02-25-1956 Attending MD: Hildred Laser , MD CSN: [id number] Age: 64 Admit Type: Outpatient Procedure:                Colonoscopy Indications:              High risk colon cancer surveillance: Personal                            history of colonic polyps Providers:                Hildred Laser, MD, Lambert Mody, Janeece Riggers,                            RN, Aram Candela, Kristine L. Risa Grill, Technician Referring MD:              Medicines:                Meperidine 50 mg IV, Midazolam 8 mg IV Complications:            No immediate complications. Estimated Blood Loss:     Estimated blood loss was minimal. Procedure:                Pre-Anesthesia Assessment:                           - Prior to the procedure, a History and Physical                            was performed, and patient medications and                            allergies were reviewed. The patient's tolerance of                            previous anesthesia was also reviewed. The risks                            and benefits of the procedure and the sedation                            options and risks were discussed with the patient.                            All questions were answered, and informed consent                            was obtained. Prior Anticoagulants: The patient has                            taken no previous anticoagulant or antiplatelet                            agents except for aspirin. ASA Grade Assessment: II                            -  A patient with mild systemic disease. After                            reviewing the risks and benefits, the patient was                            deemed in satisfactory condition to undergo the                            procedure.                           After obtaining informed consent, the colonoscope                            was  passed under direct vision. Throughout the                            procedure, the patient's blood pressure, pulse, and                            oxygen saturations were monitored continuously. The                            [id number]-[id number] ([id number]) scope was introduced through                            the anus and advanced to the the cecum, identified                            by appendiceal orifice and ileocecal valve. The                            colonoscopy was performed without difficulty. The                            patient tolerated the procedure well. The quality                            of the bowel preparation was good except the cecum                            was fair. The ileocecal valve, appendiceal orifice,                            and rectum were photographed. Scope In: 8:45:49 AM Scope Out: 8:57:40 AM Scope Withdrawal Time: 0 hours 7 minutes 45 seconds  Total Procedure Duration: 0 hours 11 minutes 51 seconds  Findings:      The perianal and digital rectal examinations were normal.      A few diverticula were found in the transverse colon and ascending colon.      A diminutive polyp was found in the ascending colon. Biopsies were taken       with a cold forceps for histology.  Internal hemorrhoids were found during retroflexion. The hemorrhoids       were small. Impression:               - Diverticulosis in the transverse colon and in the                            ascending colon.                           - One diminutive polyp in the ascending colon.                            Biopsied.                           - Internal hemorrhoids. Moderate Sedation:      Moderate (conscious) sedation was administered by the endoscopy nurse       and supervised by the endoscopist. The following parameters were       monitored: oxygen saturation, heart rate, blood pressure, CO2       capnography and response to care. Total physician intraservice time was       18  minutes. Recommendation:           - Patient has a contact number available for                            emergencies. The signs and symptoms of potential                            delayed complications were discussed with the                            patient. Return to normal activities tomorrow.                            Written discharge instructions were provided to the                            patient.                           - High fiber diet and diabetic (ADA) diet today.                           - Continue present medications.                           - No aspirin, ibuprofen, naproxen, or other                            non-steroidal anti-inflammatory drugs for 1 day.                           - Await pathology results.                           - Repeat  colonoscopy in 5 years for surveillance. Procedure Code(s):        --- Professional ---                           [phone], Colonoscopy, flexible; with biopsy, single                            or multiple                           G0500, Moderate sedation services provided by the                            same physician or other qualified health care                            professional performing a gastrointestinal                            endoscopic service that sedation supports,                            requiring the presence of an independent trained                            observer to assist in the monitoring of the                            patient's level of consciousness and physiological                            status; initial 15 minutes of intra-service time;                            patient age 61 years or older (additional time may                            be reported with [id number], as appropriate) Diagnosis Code(s):        --- Professional ---                           Z86.010, Personal history of colonic polyps                           K64.8, Other hemorrhoids                            K63.5, Polyp of colon                           K57.30, Diverticulosis of large intestine without                            perforation or abscess without bleeding CPT copyright 2019 American Medical Association. All rights reserved. The codes documented in this report are preliminary and  upon coder review may  be revised to meet current compliance requirements. Hildred Laser, MD Hildred Laser, MD 12/02/2019 9:06:07 AM This report has been signed electronically. Number of Addenda: 0

## 2019-12-03 LAB — SURGICAL PATHOLOGY

## 2019-12-08 ENCOUNTER — Encounter (HOSPITAL_COMMUNITY): Payer: Self-pay | Admitting: Internal Medicine

## 2019-12-17 ENCOUNTER — Encounter (INDEPENDENT_AMBULATORY_CARE_PROVIDER_SITE_OTHER): Payer: Self-pay | Admitting: *Deleted

## 2021-03-22 ENCOUNTER — Other Ambulatory Visit: Payer: Self-pay

## 2021-03-22 ENCOUNTER — Ambulatory Visit (INDEPENDENT_AMBULATORY_CARE_PROVIDER_SITE_OTHER): Payer: Non-veteran care

## 2021-03-22 ENCOUNTER — Ambulatory Visit (HOSPITAL_COMMUNITY)
Admission: EM | Admit: 2021-03-22 | Discharge: 2021-03-22 | Disposition: A | Discharge status: Home / Self Care | Payer: Non-veteran care | Attending: Family Medicine | Admitting: Family Medicine

## 2021-03-22 ENCOUNTER — Encounter (HOSPITAL_COMMUNITY): Payer: Self-pay

## 2021-03-22 DIAGNOSIS — R059 Cough, unspecified: Secondary | ICD-10-CM | POA: Diagnosis not present

## 2021-03-22 DIAGNOSIS — R079 Chest pain, unspecified: Secondary | ICD-10-CM | POA: Diagnosis not present

## 2021-03-22 DIAGNOSIS — R0789 Other chest pain: Secondary | ICD-10-CM

## 2021-03-22 DIAGNOSIS — M940 Chondrocostal junction syndrome [Tietze]: Secondary | ICD-10-CM | POA: Diagnosis not present

## 2021-03-22 MED ORDER — NAPROXEN 500 MG PO TABS: 500.0000 mg | ORAL_TABLET | Freq: Two times a day (BID) | ORAL | 0 refills | Status: DC

## 2021-03-22 NOTE — ED Triage Notes (Signed)
Pt presents to the office for chest soreness/ lump for several weeks. He does report having cough and congestion for several weeks.

## 2021-03-22 NOTE — ED Provider Notes (Signed)
[place]   [id number] 03/22/21 Arrival Time: 7824  ASSESSMENT & PLAN:  1. Chest wall pain   2. Costochondritis    Patient history and exam consistent with non-cardiac cause of chest pain.  I have personally viewed the imaging studies ordered this visit. CXR: No acute changes. No pneumothorax. No infiltrates.  Begin trial of: Meds ordered this encounter  Medications   naproxen (NAPROSYN) 500 MG tablet    Sig: Take 1 tablet (500 mg total) by mouth 2 (two) times daily with a meal.    Dispense:  14 tablet    Refill:  0   Activities as tolerated.   Follow-up Information     [facility].   Specialty: Urgent Care Why: If worsening or failing to improve as anticipated. Contact information: [place] [id number] [phone]               Reviewed expectations re: course of current medical issues. Questions answered. Outlined signs and symptoms indicating need for more acute intervention. Patient verbalized understanding. After Visit Summary given.   SUBJECTIVE:  History from: patient. Joseph Conrad is a 66 y.o. male who presents with complaint of fairly persistent left chest area discomfort described as dull that does not radiate. Onset gradual, first noted several weeks ago. Reports these symptoms are unchanged since beginning. Without associated n/v; without associated SOB. Injury or recent strenuous activity: none. Denies: dyspnea, fatigue, irregular heart beat, lower extremity edema, near-syncope, orthopnea, and palpitations. Aggravating factors: include certain movements, lifting arms. Alleviating factors: include rest. Recent illnesses: coughing a few weeks ago; resolved. Fever: absent. Ambulatory without assistance. Self/OTC treatment: none.  Social History   Tobacco Use  Smoking Status Never  Smokeless Tobacco Never   Social History   Substance and Sexual Activity   Alcohol Use No    OBJECTIVE:  Vitals:   03/22/21 1355  BP: (!) 151/95  Pulse: 72  Resp: 16  Temp: (!) 97.3 F (36.3 C)  TempSrc: Oral  SpO2: 100%    General appearance: alert, oriented, no acute distress Eyes: PERRLA; EOMI; conjunctivae normal HENT: normocephalic; atraumatic Neck: supple with FROM Lungs: without labored respirations; speaks full sentences without difficulty; CTAB Heart: regular Chest Wall: with tenderness to palpation over LUSB Abdomen: soft, non-tender; no guarding or rebound tenderness Extremities: without edema; without calf swelling or tenderness; symmetrical without gross deformities Skin: warm and dry; without rash or lesions Neuro: normal gait Psychological: alert and cooperative; normal mood and affect  Imaging: DG Chest 2 View  Result Date: 03/22/2021 CLINICAL DATA:  Chest pain, cough EXAM: CHEST - 2 VIEW COMPARISON:  05/18/2010. FINDINGS: Cardiac and mediastinal contours are within normal limits. No focal pulmonary opacity. No pleural effusion or pneumothorax. No acute osseous abnormality. Lower cervical spine ACDF hardware. IMPRESSION: No acute cardiopulmonary process. Electronically Signed   By: Merilyn Baba M.D.   On: 03/22/2021 14:31     No Known Allergies  Past Medical History:  Diagnosis Date   Diabetes mellitus without complication (HCC)    IDDM   Hypertension    Shortness of breath dyspnea    with exertion   Umbilical hernia    Social History   Socioeconomic History   Marital status: Single    Spouse name: Not on file   Number of children: Not on file   Years of education: Not on file   Highest education level: Not on file  Occupational History   Not on  file  Tobacco Use   Smoking status: Never   Smokeless tobacco: Never  Substance and Sexual Activity   Alcohol use: No   Drug use: No   Sexual activity: Not on file  Other Topics Concern   Not on file  Social History Narrative   Not on file   Social Determinants of  Health   Financial Resource Strain: Not on file  Food Insecurity: Not on file  Transportation Needs: Not on file  Physical Activity: Not on file  Stress: Not on file  Social Connections: Not on file  Intimate Partner Violence: Not on file   History reviewed. No pertinent family history. Past Surgical History:  Procedure Laterality Date   BIOPSY  12/02/2019   Procedure: BIOPSY;  Surgeon: Rogene Houston, MD;  [facility];  Service: Endoscopy;;   CERVICAL FUSION     COLONOSCOPY N/A 12/02/2019   Procedure: COLONOSCOPY;  Surgeon: Rogene Houston, MD;  [facility];  Service: Endoscopy;  Laterality: N/A;  830   ORIF PROXIMAL TIBIAL PLATEAU FRACTURE Left    UMBILICAL HERNIA REPAIR N/A 07/27/2015   Procedure: HERNIA REPAIR UMBILICAL ADULT;  Surgeon: Donnie Mesa, MD;  Location: [facility];  Service: General;  Laterality: Ginette Pitman, MD 03/22/21 1453

## 2022-08-05 ENCOUNTER — Encounter: Payer: Self-pay | Admitting: Urology

## 2022-08-05 ENCOUNTER — Ambulatory Visit (INDEPENDENT_AMBULATORY_CARE_PROVIDER_SITE_OTHER): Payer: No Typology Code available for payment source | Admitting: Urology

## 2022-08-05 VITALS — BP 129/78 | HR 64 | Ht 68.0 in | Wt 264.0 lb

## 2022-08-05 DIAGNOSIS — N5201 Erectile dysfunction due to arterial insufficiency: Secondary | ICD-10-CM | POA: Diagnosis not present

## 2022-08-05 DIAGNOSIS — N189 Chronic kidney disease, unspecified: Secondary | ICD-10-CM | POA: Diagnosis not present

## 2022-08-05 DIAGNOSIS — R350 Frequency of micturition: Secondary | ICD-10-CM | POA: Diagnosis not present

## 2022-08-05 DIAGNOSIS — N138 Other obstructive and reflux uropathy: Secondary | ICD-10-CM

## 2022-08-05 DIAGNOSIS — N401 Enlarged prostate with lower urinary tract symptoms: Secondary | ICD-10-CM | POA: Diagnosis not present

## 2022-08-05 NOTE — Progress Notes (Signed)
08/05/2022 9:09 AM   Joseph Conrad 04-16-1955 [id number]  Referring provider: Algis Liming, MD [address] [place],  [place] [address]  Chief Complaint  Patient presents with   New Patient (Initial Visit)    HPI: New pt -   1) bph - LUTS - pt with frequency and urgency for 6 + months. Will leak some drops. No pads. Drinks a lot caffeine - tea and soda. CT in 2020 with a 30 g prostate. No GU meds or surgery. No NG risks. He has OSA. He uses CPAP but hasn't been using it. No dysuria or gross hematuria.   AUASS = 14-15, noc x 3-4. PVR 29 ml. A Mar 2023 PSA was 2.18.   2) ED - prn sildenafil.   He has CKD with Dec 2023 cr 2.35 with gfr 30.   PVR 29 ml.   He was in the [employer] He retired from driving a truck.    PMH: Past Medical History:  Diagnosis Date   Diabetes mellitus without complication (HCC)    IDDM   Hypertension    Shortness of breath dyspnea    with exertion   Umbilical hernia     Surgical History: Past Surgical History:  Procedure Laterality Date   BIOPSY  12/02/2019   Procedure: BIOPSY;  Surgeon: Malissa Hippo, MD;  [facility];  Service: Endoscopy;;   CERVICAL FUSION     COLONOSCOPY N/A 12/02/2019   Procedure: COLONOSCOPY;  Surgeon: Malissa Hippo, MD;  [facility];  Service: Endoscopy;  Laterality: N/A;  830   ORIF PROXIMAL TIBIAL PLATEAU FRACTURE Left    UMBILICAL HERNIA REPAIR N/A 07/27/2015   Procedure: HERNIA REPAIR UMBILICAL ADULT;  Surgeon: Manus Rudd, MD;  Location: [facility];  Service: General;  Laterality: N/A;    Home Medications:  Allergies as of 08/05/2022   No Known Allergies      Medication List        Accurate as of August 05, 2022  9:09 AM. If you have any questions, ask your nurse or doctor.          STOP taking these medications    atorvastatin 40 MG tablet Commonly known as: LIPITOR Stopped by: Jerilee Field, MD   insulin glargine 100 UNIT/ML  injection Commonly known as: LANTUS Stopped by: Jerilee Field, MD   lisinopril 20 MG tablet Commonly known as: ZESTRIL Stopped by: Jerilee Field, MD       TAKE these medications    amLODipine 10 MG tablet Commonly known as: NORVASC Take 10 mg by mouth daily.   aspirin 81 MG tablet Take 1 tablet (81 mg total) by mouth daily.   carvedilol 25 MG tablet Commonly known as: COREG Take 0.5 tablets (12.5 mg total) by mouth 2 (two) times daily with a meal.   insulin aspart protamine- aspart (70-30) 100 UNIT/ML injection Commonly known as: NOVOLOG MIX 70/30 Inject 50 Units into the skin 2 (two) times daily with a meal.   losartan 100 MG tablet Commonly known as: COZAAR Take 0.5 tablets by mouth daily.   naproxen 500 MG tablet Commonly known as: NAPROSYN Take 1 tablet (500 mg total) by mouth 2 (two) times daily with a meal.   Semaglutide(0.25 or 0.5MG /DOS) 2 MG/3ML Sopn INJECT 0.5MG  SUBCUTANEOUSLY ONCE WEEKLY   Vitamin D 50 MCG (2000 UT) Caps Take 2,000 Units by mouth daily.        Allergies: No Known Allergies  Family History:  No family history on file.  Social History:  reports that he has never smoked. He has never used smokeless tobacco. He reports that he does not drink alcohol and does not use drugs.   Physical Exam: BP 129/78   Pulse 64   Ht 5\' 8"  (1.727 m)   Wt 264 lb (119.7 kg)   BMI 40.14 kg/m   Constitutional:  Alert and oriented, No acute distress. HEENT: [place] AT, moist mucus membranes.  Trachea midline, no masses. Cardiovascular: No clubbing, cyanosis, or edema. Respiratory: Normal respiratory effort, no increased work of breathing. GI: Abdomen is soft, nontender, nondistended, no abdominal masses GU: No CVA tenderness Skin: No rashes, bruises or suspicious lesions. Neurologic: Grossly intact, no focal deficits, moving all 4 extremities. Psychiatric: Normal mood and affect. DRE: Prostate 30 g, smooth without hard area or nodule     Laboratory Data: Lab Results  Component Value Date   WBC 7.1 02/12/2019   HGB 11.7 (L) 02/12/2019   HCT 35.8 (L) 02/12/2019   MCV 88.0 02/12/2019   PLT 143 (L) 02/12/2019    Lab Results  Component Value Date   CREATININE 2.41 (H) 02/12/2019    Lab Results  Component Value Date   PSA 0.64 12/30/2005    Lab Results  Component Value Date   TESTOSTERONE 328.69 (L) 11/21/2006    Lab Results  Component Value Date   HGBA1C 6.9 (H) 02/10/2019    Urinalysis    Component Value Date/Time   COLORURINE YELLOW 02/10/2019 1624   APPEARANCEUR CLEAR 02/10/2019 1624   LABSPEC 1.025 02/10/2019 1624   PHURINE 5.0 02/10/2019 1624   GLUCOSEU NEGATIVE 02/10/2019 1624   GLUCOSEU NEGATIVE 12/30/2005 0906   HGBUR NEGATIVE 02/10/2019 1624   BILIRUBINUR NEGATIVE 02/10/2019 1624   KETONESUR NEGATIVE 02/10/2019 1624   PROTEINUR 30 (A) 02/10/2019 1624   UROBILINOGEN 0.2 mg/dL 16/12/9602 5409   NITRITE NEGATIVE 02/10/2019 1624   LEUKOCYTESUR NEGATIVE 02/10/2019 1624    Lab Results  Component Value Date   BACTERIA RARE (A) 02/10/2019    Pertinent Imaging: CT 2020 - images reviewed   34 pages from [place] reviewed   Assessment & Plan:    BPH - disc BT and to use CPAP. Also disc surv, meds, procedures. He will cut back on caffeine and drink more water. PSA was sent.   ED - disc nature r/b/a to pde5i/   No follow-ups on file.  Jerilee Field, MD  [facility]  [address] [place], [place] [address] [phone]

## 2022-08-06 LAB — PSA: Prostate Specific Ag, Serum: 2.6 ng/mL (ref 0.0–4.0)

## 2022-09-10 ENCOUNTER — Other Ambulatory Visit: Payer: Non-veteran care

## 2022-09-16 ENCOUNTER — Ambulatory Visit: Payer: Non-veteran care | Admitting: Urology

## 2022-10-20 IMAGING — DX DG CHEST 2V
3 series · 3 of 3 positions shown · non-contrast
Comparison: 05/18/2010.

CLINICAL DATA: Chest pain, cough

EXAM:
CHEST - 2 VIEW

[chest pa]
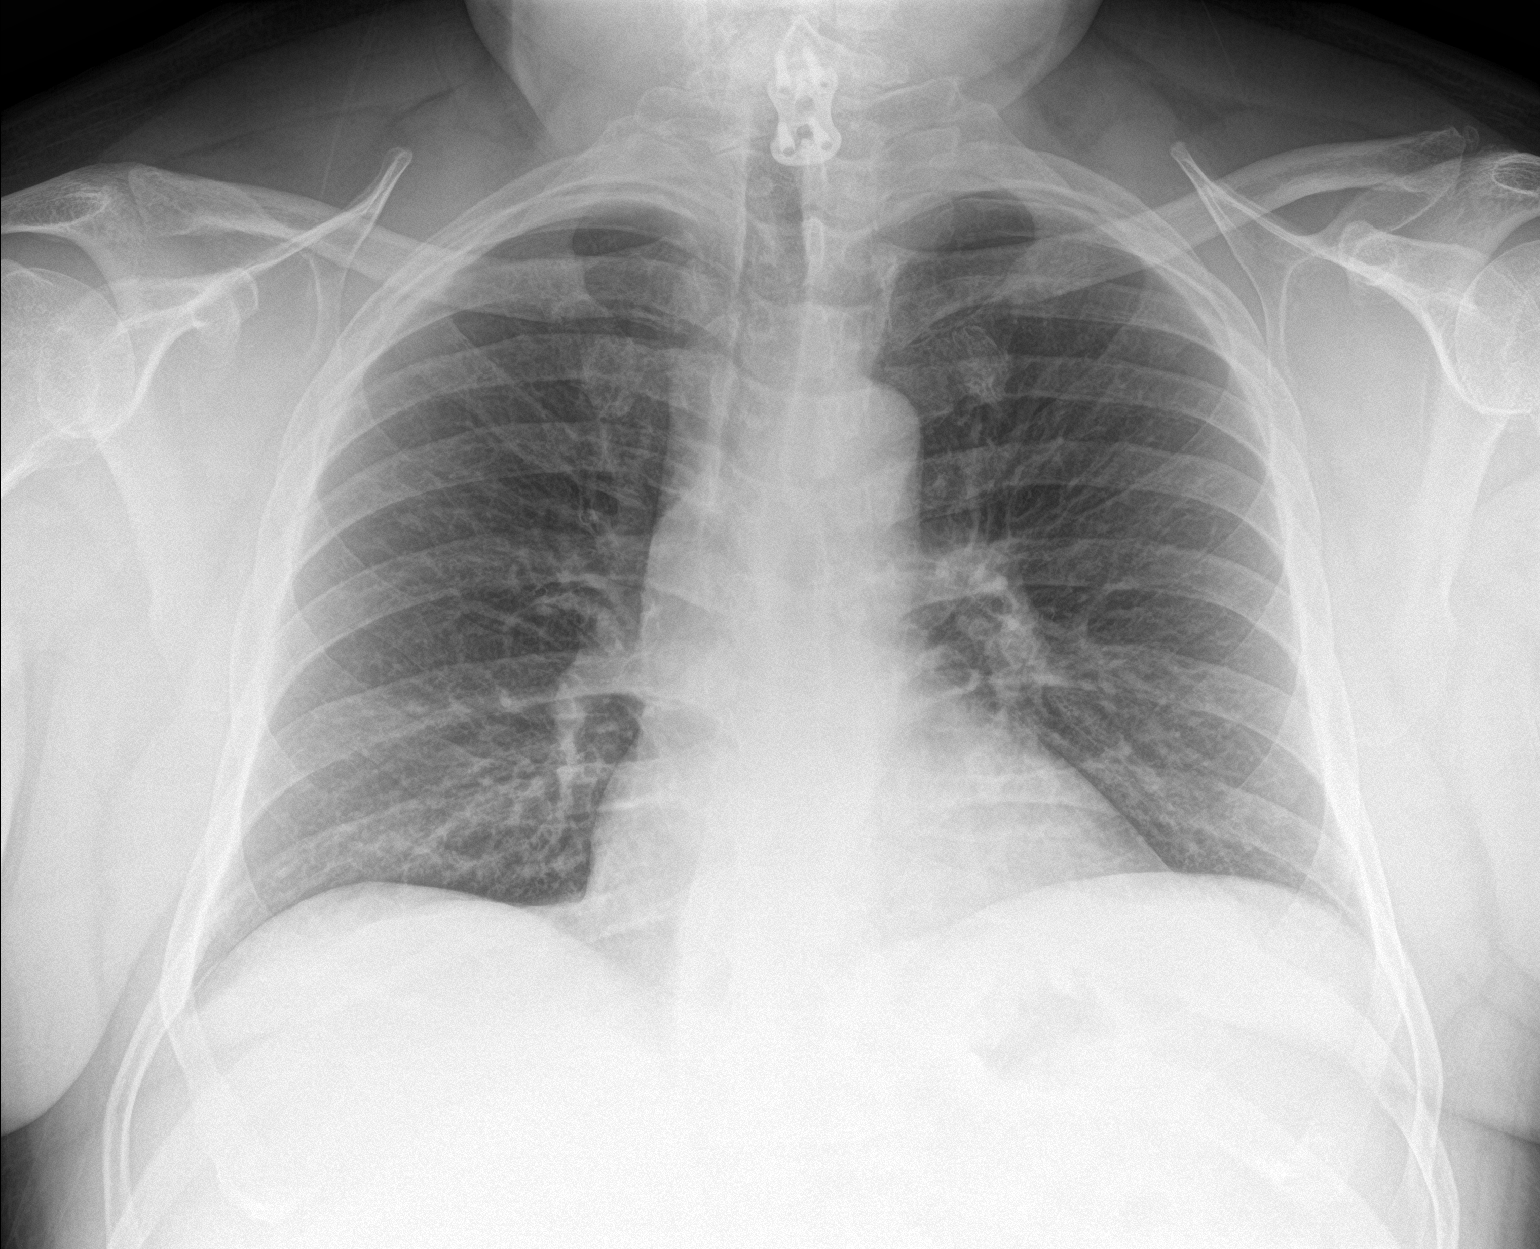

[chest lat (1 of 2)]
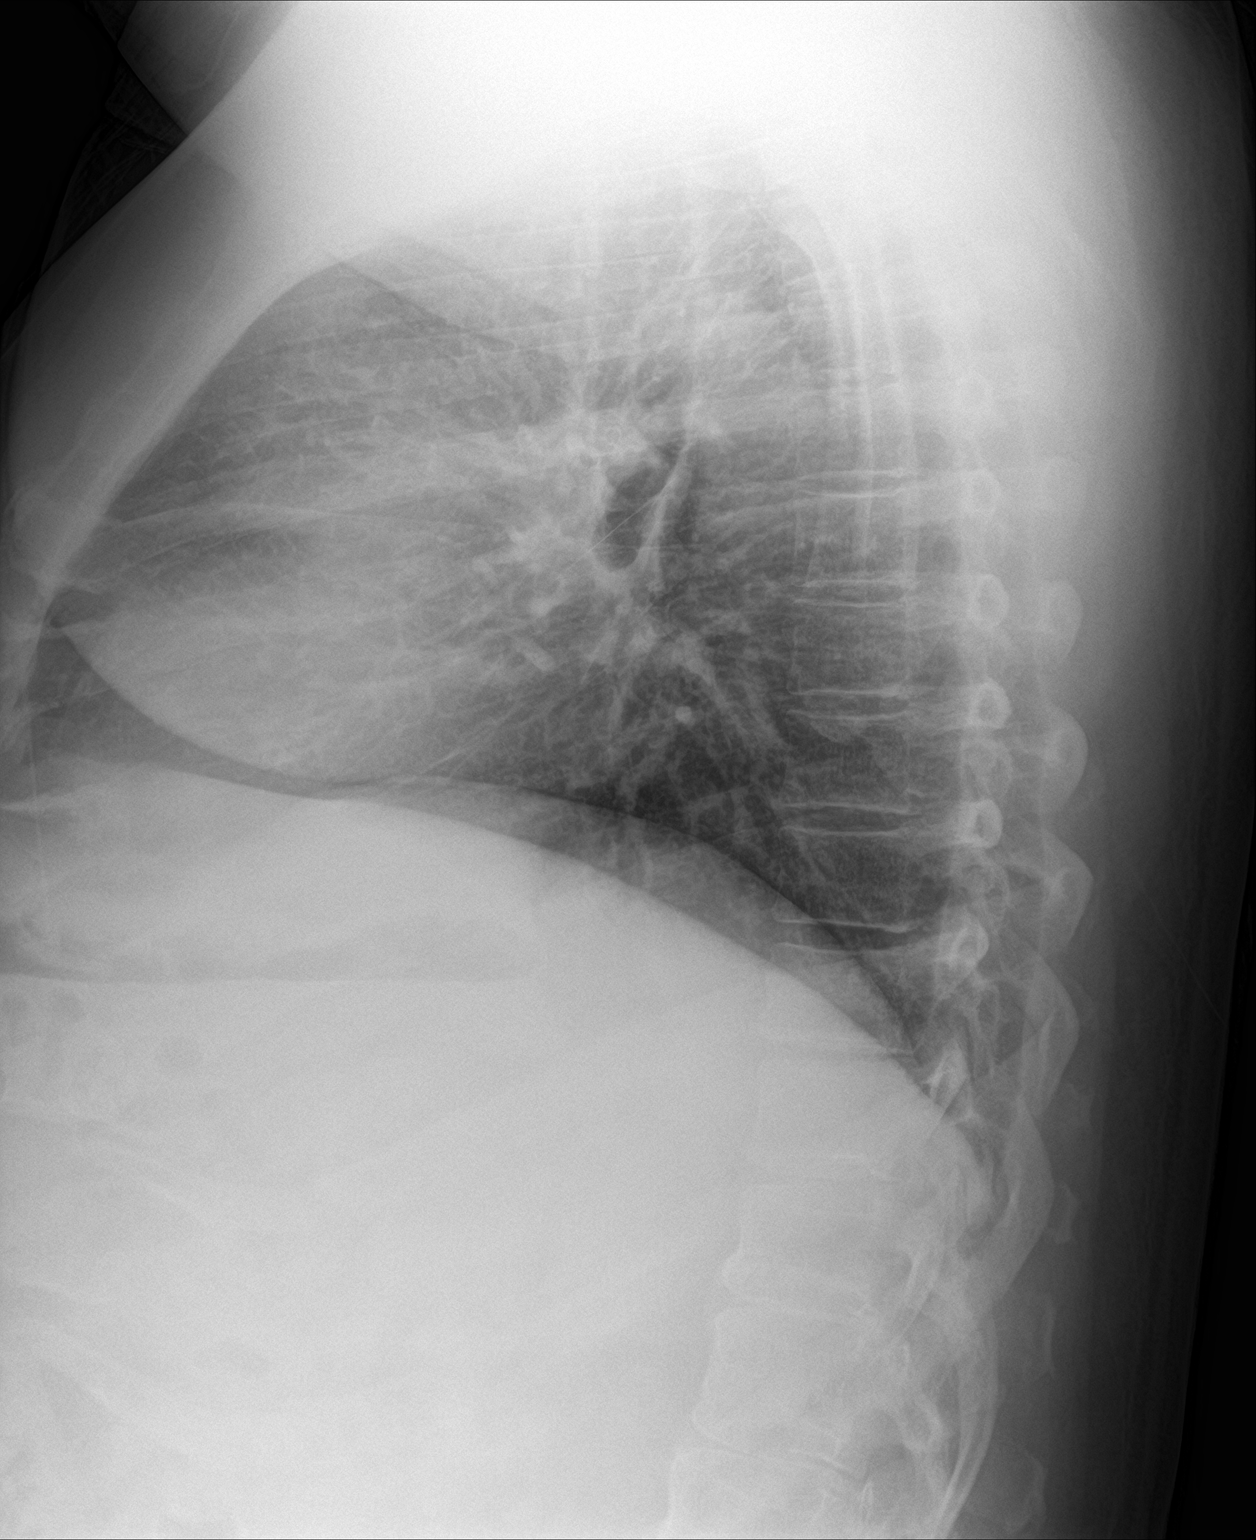

[chest lat (2 of 2)]
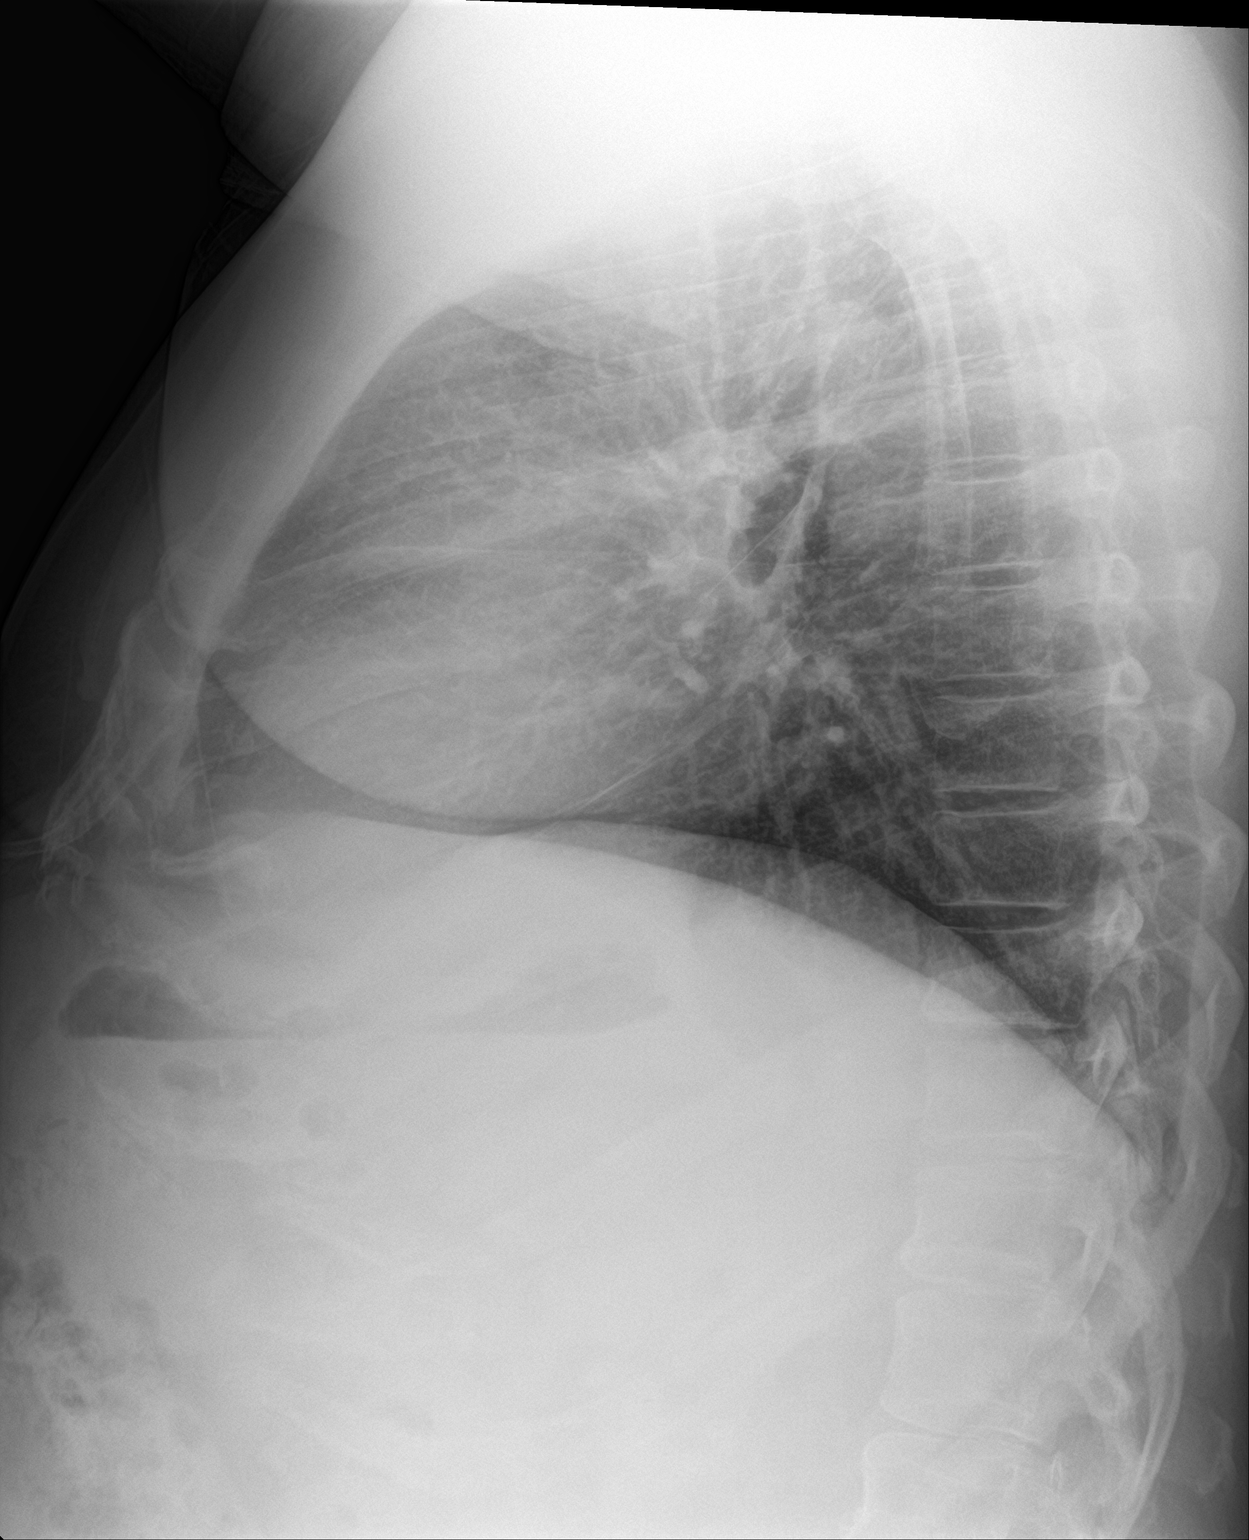

[3 of 3 positions shown; findings below may reference images not displayed]

FINDINGS: Cardiac and mediastinal contours are within normal limits. No focal
pulmonary opacity. No pleural effusion or pneumothorax. No acute
osseous abnormality. Lower cervical spine ACDF hardware.
IMPRESSION: No acute cardiopulmonary process.

## 2023-05-03 ENCOUNTER — Emergency Department (HOSPITAL_COMMUNITY)
Admission: EM | Admit: 2023-05-03 | Discharge: 2023-05-03 | Disposition: A | Discharge status: Home / Self Care | Attending: Emergency Medicine | Admitting: Emergency Medicine

## 2023-05-03 ENCOUNTER — Other Ambulatory Visit: Payer: Self-pay

## 2023-05-03 ENCOUNTER — Encounter (HOSPITAL_COMMUNITY): Payer: Self-pay | Admitting: Pharmacy Technician

## 2023-05-03 DIAGNOSIS — Z7982 Long term (current) use of aspirin: Secondary | ICD-10-CM | POA: Diagnosis not present

## 2023-05-03 DIAGNOSIS — E11649 Type 2 diabetes mellitus with hypoglycemia without coma: Secondary | ICD-10-CM | POA: Insufficient documentation

## 2023-05-03 DIAGNOSIS — E162 Hypoglycemia, unspecified: Secondary | ICD-10-CM

## 2023-05-03 DIAGNOSIS — E1122 Type 2 diabetes mellitus with diabetic chronic kidney disease: Secondary | ICD-10-CM | POA: Insufficient documentation

## 2023-05-03 DIAGNOSIS — Z79899 Other long term (current) drug therapy: Secondary | ICD-10-CM | POA: Diagnosis not present

## 2023-05-03 DIAGNOSIS — Z794 Long term (current) use of insulin: Secondary | ICD-10-CM | POA: Insufficient documentation

## 2023-05-03 DIAGNOSIS — I129 Hypertensive chronic kidney disease with stage 1 through stage 4 chronic kidney disease, or unspecified chronic kidney disease: Secondary | ICD-10-CM | POA: Diagnosis not present

## 2023-05-03 DIAGNOSIS — N189 Chronic kidney disease, unspecified: Secondary | ICD-10-CM | POA: Diagnosis not present

## 2023-05-03 LAB — CBC WITH DIFFERENTIAL/PLATELET
Abs Immature Granulocytes: 0.02 10*3/uL (ref 0.00–0.07)
Basophils Absolute: 0 10*3/uL (ref 0.0–0.1)
Basophils Relative: 0 %
Eosinophils Absolute: 0.3 10*3/uL (ref 0.0–0.5)
Eosinophils Relative: 4 %
HCT: 38.7 % — ABNORMAL LOW (ref 39.0–52.0)
Hemoglobin: 12.1 g/dL — ABNORMAL LOW (ref 13.0–17.0)
Immature Granulocytes: 0 %
Lymphocytes Relative: 13 %
Lymphs Abs: 1 10*3/uL (ref 0.7–4.0)
MCH: 27.9 pg (ref 26.0–34.0)
MCHC: 31.3 g/dL (ref 30.0–36.0)
MCV: 89.4 fL (ref 80.0–100.0)
Monocytes Absolute: 0.6 10*3/uL (ref 0.1–1.0)
Monocytes Relative: 8 %
Neutro Abs: 5.6 10*3/uL (ref 1.7–7.7)
Neutrophils Relative %: 75 %
Platelets: 193 10*3/uL (ref 150–400)
RBC: 4.33 MIL/uL (ref 4.22–5.81)
RDW: 13.6 % (ref 11.5–15.5)
WBC: 7.5 10*3/uL (ref 4.0–10.5)
nRBC: 0 % (ref 0.0–0.2)

## 2023-05-03 LAB — COMPREHENSIVE METABOLIC PANEL
ALT: 13 U/L (ref 0–44)
AST: 28 U/L (ref 15–41)
Albumin: 3.3 g/dL — ABNORMAL LOW (ref 3.5–5.0)
Alkaline Phosphatase: 49 U/L (ref 38–126)
Anion gap: 13 (ref 5–15)
BUN: 42 mg/dL — ABNORMAL HIGH (ref 8–23)
CO2: 22 mmol/L (ref 22–32)
Calcium: 8.7 mg/dL — ABNORMAL LOW (ref 8.9–10.3)
Chloride: 103 mmol/L (ref 98–111)
Creatinine, Ser: 3.16 mg/dL — ABNORMAL HIGH (ref 0.61–1.24)
GFR, Estimated: 21 mL/min — ABNORMAL LOW (ref >60)
Glucose, Bld: 131 mg/dL — ABNORMAL HIGH (ref 70–99)
Potassium: 3.5 mmol/L (ref 3.5–5.1)
Sodium: 138 mmol/L (ref 135–145)
Total Bilirubin: 0.7 mg/dL (ref 0.0–1.2)
Total Protein: 7.2 g/dL (ref 6.5–8.1)

## 2023-05-03 LAB — CBG MONITORING, ED
Glucose-Capillary: 125 mg/dL — ABNORMAL HIGH (ref 70–99)
Glucose-Capillary: 204 mg/dL — ABNORMAL HIGH (ref 70–99)

## 2023-05-03 LAB — MAGNESIUM: Magnesium: 2.8 mg/dL — ABNORMAL HIGH (ref 1.7–2.4)

## 2023-05-03 MED ORDER — NALOXONE HCL 4 MG/0.1ML NA LIQD: NASAL | 0 refills | Status: AC

## 2023-05-03 NOTE — ED Provider Notes (Signed)
 [place] EMERGENCY DEPARTMENT AT [facility] Provider Note   CSN: [id number] Arrival date & time: 05/03/23  1625     History  Chief Complaint  Patient presents with   Hypoglycemia    Joseph Conrad is a 68 y.o. male.   Hypoglycemia Patient presents for episode of unresponsiveness.  Medical history includes DM, HLD, HTN, CKD, OSA.  He underwent right knee surgery on Monday.  He was prescribed oxycodone.  For his diabetes, he takes 50 units of short acting insulin twice a day before meals.  He states that he took his insulin today and has not eaten.  After taking some of his pain medication, he went unresponsive.  EMS was called to his home.  Responders provided intranasal Narcan.  When EMS arrived, patient was awake but altered.  CBG was 48.  He was given glucagon and p.o. Pepsi.  Patient had return to mental baseline after that.  Patient denies any current complaints.     Home Medications Prior to Admission medications   Medication Sig Start Date End Date Taking? Authorizing Provider  cyclobenzaprine (FLEXERIL) 10 MG tablet Take 10 mg by mouth 3 (three) times daily as needed for muscle spasms.   Yes [provider]  docusate sodium (COLACE) 100 MG capsule Take 100 mg by mouth 2 (two) times daily.   Yes [provider]  ferrous sulfate 325 (65 FE) MG EC tablet Take 325 mg by mouth every Monday, Wednesday, and Friday.   Yes [provider]  naloxone [facility]) nasal spray 4 mg/0.1 mL In the event of a narcotic overdose, spray into nostril and call 911. 05/03/23  Yes Gloris Manchester, MD  oxycodone (OXY-IR) 5 MG capsule Take 5 mg by mouth every 4 (four) hours as needed for pain.   Yes [provider]  simvastatin (ZOCOR) 20 MG tablet Take 20 mg by mouth at bedtime.   Yes [provider]  amLODipine (NORVASC) 5 MG tablet Take 5 mg by mouth daily.   Yes [provider]  aspirin 325 MG tablet Take 325 mg by mouth in the morning  and at bedtime. 12/02/19  Yes Rehman, Joline Maxcy, MD  carvedilol (COREG) 25 MG tablet Take 0.5 tablets (12.5 mg total) by mouth 2 (two) times daily with a meal. Patient taking differently: Take 25 mg by mouth 2 (two) times daily with a meal. 02/13/19  Yes Barnetta Chapel, MD  Cholecalciferol (VITAMIN D) 50 MCG (2000 UT) CAPS Take 2,000 Units by mouth daily.     [provider]  insulin aspart protamine- aspart (NOVOLOG MIX 70/30) (70-30) 100 UNIT/ML injection Inject 50 Units into the skin 2 (two) times daily with a meal.   Yes [provider]  losartan (COZAAR) 100 MG tablet Take 100 mg by mouth daily. 05/08/22  Yes [provider]  Semaglutide,0.25 or 0.5MG /DOS, 2 MG/3ML SOPN Inject 0.25 mg into the skin every Monday. 07/03/22  Yes [provider]      Allergies    Patient has no known allergies.    Review of Systems   Review of Systems  Musculoskeletal:  Positive for arthralgias (Postoperative).  Neurological:        Episode of unresponsiveness  Psychiatric/Behavioral:  Positive for confusion.   All other systems reviewed and are negative.   Physical Exam Updated Vital Signs BP 137/64   Pulse 75   Temp (!) 97.5 F (36.4 C) (Oral)   Resp 19   SpO2 95%  Physical  Exam Vitals and nursing note reviewed.  Constitutional:      General: He is not in acute distress.    Appearance: Normal appearance. He is well-developed. He is not ill-appearing, toxic-appearing or diaphoretic.  HENT:     Head: Normocephalic and atraumatic.     Right Ear: External ear normal.     Left Ear: External ear normal.     Nose: Nose normal.     Mouth/Throat:     Mouth: Mucous membranes are moist.  Eyes:     Extraocular Movements: Extraocular movements intact.     Conjunctiva/sclera: Conjunctivae normal.  Cardiovascular:     Rate and Rhythm: Normal rate and regular rhythm.     Heart sounds: No murmur heard. Pulmonary:     Effort: Pulmonary effort is normal. No  respiratory distress.     Breath sounds: Normal breath sounds. No wheezing or rales.  Chest:     Chest wall: No tenderness.  Abdominal:     General: There is no distension.     Palpations: Abdomen is soft.     Tenderness: There is no abdominal tenderness.  Musculoskeletal:        General: No swelling or deformity.     Cervical back: Normal range of motion and neck supple.     Comments: Aquacel dressing in place on anterior right knee.  Expected warmth postoperatively.  No erythema.  No drainage.  Skin:    General: Skin is warm and dry.     Coloration: Skin is not jaundiced or pale.  Neurological:     General: No focal deficit present.     Mental Status: He is alert and oriented to person, place, and time.  Psychiatric:        Mood and Affect: Mood normal.        Behavior: Behavior normal.     ED Results / Procedures / Treatments   Labs (all labs ordered are listed, but only abnormal results are displayed) Labs Reviewed  CBC WITH DIFFERENTIAL/PLATELET - Abnormal; Notable for the following components:      Result Value   Hemoglobin 12.1 (*)    HCT 38.7 (*)    All other components within normal limits  COMPREHENSIVE METABOLIC PANEL - Abnormal; Notable for the following components:   Glucose, Bld 131 (*)    BUN 42 (*)    Creatinine, Ser 3.16 (*)    Calcium 8.7 (*)    Albumin 3.3 (*)    GFR, Estimated 21 (*)    All other components within normal limits  MAGNESIUM - Abnormal; Notable for the following components:   Magnesium 2.8 (*)    All other components within normal limits  CBG MONITORING, ED - Abnormal; Notable for the following components:   Glucose-Capillary 125 (*)    All other components within normal limits  CBG MONITORING, ED - Abnormal; Notable for the following components:   Glucose-Capillary 204 (*)    All other components within normal limits  URINALYSIS, ROUTINE W REFLEX MICROSCOPIC    EKG EKG Interpretation Date/Time:  Saturday May 03 2023 17:04:55  EST Ventricular Rate:  79 PR Interval:  157 QRS Duration:  99 QT Interval:  438 QTC Calculation: 503 R Axis:   -28  Text Interpretation: Sinus rhythm Borderline left axis deviation Prolonged QT interval Baseline wander in lead(s) V2 Confirmed by Gloris Manchester ([id number]) on 05/03/2023 5:10:15 PM  Radiology No results found.  Procedures Procedures    Medications Ordered in ED Medications - No data  to display  ED Course/ Medical Decision Making/ A&P                                 Medical Decision Making Amount and/or Complexity of Data Reviewed Labs: ordered.   This patient presents to the ED for concern of episode of unresponsiveness, this involves an extensive number of treatment options, and is a complaint that carries with it a high risk of complications and morbidity.  The differential diagnosis includes opiate narcosis, hypoglycemia, other metabolic derangements, syncope, arrhythmia   Co morbidities that complicate the patient evaluation  DM, HLD, HTN, CKD, OSA   Additional history obtained:  Additional history obtained from patient's family External records from outside source obtained and reviewed including EMR   Lab Tests:  I Ordered, and personally interpreted labs.  The pertinent results include: Baseline anemia, no leukocytosis, baseline CKD, normal electrolytes, normal glucose while in the ED  Cardiac Monitoring: / EKG:  The patient was maintained on a cardiac monitor.  I personally viewed and interpreted the cardiac monitored which showed an underlying rhythm of: Sinus rhythm  Problem List / ED Course / Critical interventions / Medication management  Patient presents for episode of unresponsiveness.  This appears to be a mixed picture.  He did have some response to Narcan.  He was also found to have hypoglycemia.  This is corrected on his arrival in the ED.  Patient denies any physical complaints.  He is overall well-appearing on exam.  He has no focal  neurologic deficits.  He is currently 5 days postoperative from right knee surgery.  On inspection of right knee, Aquacel dressing remains in place.  There is no concerning erythema or drainage.  Patient denies any intent of self-harm.  Prior to her surgery, he was not on any narcotic pain medication.  He is opiate nave.  He was prescribed 5 mg oxycodones.  Patient also explains his hypoglycemia as taking his insulin without eating today.  Patient was kept on monitor.  Lab work was initiated.  Lab work was unremarkable.  Patient maintained normal blood glucose while in the ED.  He was able to eat a meal.  Patient was advised to utilize extreme caution when taking narcotics and to split his current dose in half.  He was also advised to avoid any missed meals in the setting of his large insulin doses.  Patient and family are comfortable with discharge home.  Patient was discharged in stable condition.  Social Determinants of Health:  Lives at home with family, has access to outpatient care through the [place]        Final Clinical Impression(s) / ED Diagnoses Final diagnoses:  Hypoglycemia    Rx / DC Orders ED Discharge Orders          Ordered    naloxone (NARCAN) nasal spray 4 mg/0.1 mL        05/03/23 2000              Gloris Manchester, MD 05/03/23 2001

## 2023-05-03 NOTE — Discharge Instructions (Signed)
 Avoid any missed meals following your insulin doses.    Make sure that you cut your narcotic pain medication in half and avoid taking more than 2.5 mg at a time.  If you do experience another accidental narcotic overdose, a prescription for Narcan was sent to pharmacy.  In the event of an overdose, spray in the nostril and call 911.  Turn to the emergency department for any new or worsening symptoms of concern.

## 2023-05-03 NOTE — ED Triage Notes (Signed)
 Pt bib ems after call for overdose. PD found pt to be unresponsive with snoring respirations. PD gave narcan. On EMS arrival on scene, pt incoherent.  Cbg 48Given 1mg  glucogon, drank a glass of pepsi with 15g glucose.  CBG 133 on recheck. Pt remained alert and oriented after glucose administration. Pt took his insulin once today but has not eaten.

## 2023-06-10 DIAGNOSIS — R0602 Shortness of breath: Secondary | ICD-10-CM | POA: Diagnosis not present

## 2023-06-10 DIAGNOSIS — I10 Essential (primary) hypertension: Secondary | ICD-10-CM | POA: Diagnosis not present

## 2023-06-10 DIAGNOSIS — R55 Syncope and collapse: Secondary | ICD-10-CM | POA: Diagnosis not present

## 2023-06-10 DIAGNOSIS — R079 Chest pain, unspecified: Secondary | ICD-10-CM | POA: Diagnosis not present

## 2023-06-10 DIAGNOSIS — E11649 Type 2 diabetes mellitus with hypoglycemia without coma: Secondary | ICD-10-CM | POA: Diagnosis not present

## 2023-08-13 ENCOUNTER — Inpatient Hospital Stay (HOSPITAL_COMMUNITY)
Admission: EM | Admit: 2023-08-13 | Discharge: 2023-08-22 | DRG: 682 | Disposition: A | Discharge status: Home Health | Attending: Family Medicine | Admitting: Family Medicine

## 2023-08-13 ENCOUNTER — Emergency Department (HOSPITAL_COMMUNITY)

## 2023-08-13 DIAGNOSIS — R55 Syncope and collapse: Secondary | ICD-10-CM | POA: Diagnosis not present

## 2023-08-13 DIAGNOSIS — E785 Hyperlipidemia, unspecified: Secondary | ICD-10-CM | POA: Diagnosis present

## 2023-08-13 DIAGNOSIS — J690 Pneumonitis due to inhalation of food and vomit: Secondary | ICD-10-CM | POA: Diagnosis present

## 2023-08-13 DIAGNOSIS — Z860100 Personal history of colon polyps, unspecified: Secondary | ICD-10-CM

## 2023-08-13 DIAGNOSIS — N184 Chronic kidney disease, stage 4 (severe): Secondary | ICD-10-CM | POA: Diagnosis present

## 2023-08-13 DIAGNOSIS — I10 Essential (primary) hypertension: Secondary | ICD-10-CM | POA: Diagnosis present

## 2023-08-13 DIAGNOSIS — E1322 Other specified diabetes mellitus with diabetic chronic kidney disease: Secondary | ICD-10-CM

## 2023-08-13 DIAGNOSIS — G4733 Obstructive sleep apnea (adult) (pediatric): Secondary | ICD-10-CM | POA: Diagnosis present

## 2023-08-13 DIAGNOSIS — I9589 Other hypotension: Secondary | ICD-10-CM | POA: Diagnosis present

## 2023-08-13 DIAGNOSIS — Z79899 Other long term (current) drug therapy: Secondary | ICD-10-CM | POA: Diagnosis not present

## 2023-08-13 DIAGNOSIS — R1111 Vomiting without nausea: Secondary | ICD-10-CM | POA: Diagnosis not present

## 2023-08-13 DIAGNOSIS — E8809 Other disorders of plasma-protein metabolism, not elsewhere classified: Secondary | ICD-10-CM | POA: Diagnosis present

## 2023-08-13 DIAGNOSIS — K56609 Unspecified intestinal obstruction, unspecified as to partial versus complete obstruction: Secondary | ICD-10-CM | POA: Diagnosis present

## 2023-08-13 DIAGNOSIS — R531 Weakness: Secondary | ICD-10-CM | POA: Diagnosis not present

## 2023-08-13 DIAGNOSIS — E872 Acidosis, unspecified: Secondary | ICD-10-CM | POA: Diagnosis present

## 2023-08-13 DIAGNOSIS — E11649 Type 2 diabetes mellitus with hypoglycemia without coma: Secondary | ICD-10-CM | POA: Diagnosis present

## 2023-08-13 DIAGNOSIS — Z794 Long term (current) use of insulin: Secondary | ICD-10-CM

## 2023-08-13 DIAGNOSIS — Z7982 Long term (current) use of aspirin: Secondary | ICD-10-CM

## 2023-08-13 DIAGNOSIS — N170 Acute kidney failure with tubular necrosis: Principal | ICD-10-CM | POA: Diagnosis present

## 2023-08-13 DIAGNOSIS — R112 Nausea with vomiting, unspecified: Secondary | ICD-10-CM

## 2023-08-13 DIAGNOSIS — E162 Hypoglycemia, unspecified: Secondary | ICD-10-CM | POA: Diagnosis present

## 2023-08-13 DIAGNOSIS — R571 Hypovolemic shock: Secondary | ICD-10-CM | POA: Diagnosis present

## 2023-08-13 DIAGNOSIS — N189 Chronic kidney disease, unspecified: Secondary | ICD-10-CM

## 2023-08-13 DIAGNOSIS — D631 Anemia in chronic kidney disease: Secondary | ICD-10-CM | POA: Diagnosis present

## 2023-08-13 DIAGNOSIS — I959 Hypotension, unspecified: Secondary | ICD-10-CM | POA: Diagnosis present

## 2023-08-13 DIAGNOSIS — Z981 Arthrodesis status: Secondary | ICD-10-CM | POA: Diagnosis not present

## 2023-08-13 DIAGNOSIS — N179 Acute kidney failure, unspecified: Secondary | ICD-10-CM | POA: Diagnosis not present

## 2023-08-13 DIAGNOSIS — E1122 Type 2 diabetes mellitus with diabetic chronic kidney disease: Secondary | ICD-10-CM | POA: Diagnosis present

## 2023-08-13 DIAGNOSIS — I129 Hypertensive chronic kidney disease with stage 1 through stage 4 chronic kidney disease, or unspecified chronic kidney disease: Secondary | ICD-10-CM | POA: Diagnosis present

## 2023-08-13 DIAGNOSIS — N400 Enlarged prostate without lower urinary tract symptoms: Secondary | ICD-10-CM | POA: Diagnosis present

## 2023-08-13 DIAGNOSIS — Z634 Disappearance and death of family member: Secondary | ICD-10-CM | POA: Diagnosis not present

## 2023-08-13 DIAGNOSIS — E861 Hypovolemia: Secondary | ICD-10-CM | POA: Diagnosis present

## 2023-08-13 DIAGNOSIS — E860 Dehydration: Secondary | ICD-10-CM | POA: Diagnosis present

## 2023-08-13 DIAGNOSIS — E119 Type 2 diabetes mellitus without complications: Secondary | ICD-10-CM

## 2023-08-13 DIAGNOSIS — R638 Other symptoms and signs concerning food and fluid intake: Secondary | ICD-10-CM

## 2023-08-13 DIAGNOSIS — K56600 Partial intestinal obstruction, unspecified as to cause: Secondary | ICD-10-CM | POA: Diagnosis present

## 2023-08-13 DIAGNOSIS — Z6837 Body mass index (BMI) 37.0-37.9, adult: Secondary | ICD-10-CM

## 2023-08-13 DIAGNOSIS — R9431 Abnormal electrocardiogram [ECG] [EKG]: Secondary | ICD-10-CM | POA: Diagnosis present

## 2023-08-13 LAB — COMPREHENSIVE METABOLIC PANEL WITH GFR
ALT: 25 U/L (ref 0–44)
AST: 18 U/L (ref 15–41)
Albumin: 3.5 g/dL (ref 3.5–5.0)
Alkaline Phosphatase: 61 U/L (ref 38–126)
Anion gap: 30 — ABNORMAL HIGH (ref 5–15)
BUN: 96 mg/dL — ABNORMAL HIGH (ref 8–23)
CO2: 24 mmol/L (ref 22–32)
Calcium: 7.8 mg/dL — ABNORMAL LOW (ref 8.9–10.3)
Chloride: 88 mmol/L — ABNORMAL LOW (ref 98–111)
Creatinine, Ser: 12.37 mg/dL — ABNORMAL HIGH (ref 0.61–1.24)
GFR, Estimated: 4 mL/min — ABNORMAL LOW (ref >60)
Glucose, Bld: 66 mg/dL — ABNORMAL LOW (ref 70–99)
Potassium: 4.1 mmol/L (ref 3.5–5.1)
Sodium: 142 mmol/L (ref 135–145)
Total Bilirubin: 0.7 mg/dL (ref 0.0–1.2)
Total Protein: 7.8 g/dL (ref 6.5–8.1)

## 2023-08-13 LAB — CBC WITH DIFFERENTIAL/PLATELET
Abs Immature Granulocytes: 0.05 10*3/uL (ref 0.00–0.07)
Basophils Absolute: 0 10*3/uL (ref 0.0–0.1)
Basophils Relative: 0 %
Eosinophils Absolute: 0 10*3/uL (ref 0.0–0.5)
Eosinophils Relative: 0 %
HCT: 45.9 % (ref 39.0–52.0)
Hemoglobin: 15.1 g/dL (ref 13.0–17.0)
Immature Granulocytes: 1 %
Lymphocytes Relative: 18 %
Lymphs Abs: 1.3 10*3/uL (ref 0.7–4.0)
MCH: 28.2 pg (ref 26.0–34.0)
MCHC: 32.9 g/dL (ref 30.0–36.0)
MCV: 85.8 fL (ref 80.0–100.0)
Monocytes Absolute: 1.1 10*3/uL — ABNORMAL HIGH (ref 0.1–1.0)
Monocytes Relative: 15 %
Neutro Abs: 4.7 10*3/uL (ref 1.7–7.7)
Neutrophils Relative %: 66 %
Platelets: 255 10*3/uL (ref 150–400)
RBC: 5.35 MIL/uL (ref 4.22–5.81)
RDW: 14.6 % (ref 11.5–15.5)
WBC: 7.2 10*3/uL (ref 4.0–10.5)
nRBC: 0 % (ref 0.0–0.2)

## 2023-08-13 LAB — CBG MONITORING, ED
Glucose-Capillary: 54 mg/dL — ABNORMAL LOW (ref 70–99)
Glucose-Capillary: 58 mg/dL — ABNORMAL LOW (ref 70–99)

## 2023-08-13 LAB — LACTIC ACID, PLASMA
Lactic Acid, Venous: 3.5 mmol/L (ref 0.5–1.9)
Lactic Acid, Venous: 2.2 mmol/L (ref 0.5–1.9)

## 2023-08-13 LAB — LIPASE, BLOOD: Lipase: 61 U/L — ABNORMAL HIGH (ref 11–51)

## 2023-08-13 MED ORDER — DEXTROSE 50 % IV SOLN
INTRAVENOUS | Status: AC
  Administered 2023-08-13: 50 mL via INTRAVENOUS
  Filled 2023-08-13: qty 50

## 2023-08-13 MED ORDER — ACETAMINOPHEN 325 MG PO TABS: 650.0000 mg | ORAL_TABLET | Freq: Four times a day (QID) | ORAL | Status: DC | PRN

## 2023-08-13 MED ORDER — DEXTROSE 10 % IV SOLN: INTRAVENOUS | Status: DC

## 2023-08-13 MED ORDER — LACTATED RINGERS IV BOLUS
1000.0000 mL | Freq: Once | INTRAVENOUS | Status: AC
  Administered 2023-08-13: 1000 mL via INTRAVENOUS

## 2023-08-13 MED ORDER — CHLORHEXIDINE GLUCONATE CLOTH 2 % EX PADS
6.0000 | MEDICATED_PAD | Freq: Every day | CUTANEOUS | Status: DC
  Administered 2023-08-14 – 2023-08-22 (×8): 6 via TOPICAL

## 2023-08-13 MED ORDER — HYDROMORPHONE HCL 1 MG/ML IJ SOLN
0.5000 mg | INTRAMUSCULAR | Status: DC | PRN
  Administered 2023-08-14 – 2023-08-20 (×16): 0.5 mg via INTRAVENOUS
  Filled 2023-08-13 (×16): qty 0.5

## 2023-08-13 MED ORDER — ACETAMINOPHEN 650 MG RE SUPP: 650.0000 mg | Freq: Four times a day (QID) | RECTAL | Status: DC | PRN

## 2023-08-13 MED ORDER — DEXTROSE 50 % IV SOLN
50.0000 mL | Freq: Once | INTRAVENOUS | Status: AC
  Administered 2023-08-13: 50 mL via INTRAVENOUS

## 2023-08-13 MED ORDER — LACTATED RINGERS IV SOLN: INTRAVENOUS | Status: DC

## 2023-08-13 MED ORDER — ONDANSETRON HCL 4 MG/2ML IJ SOLN
4.0000 mg | Freq: Once | INTRAMUSCULAR | Status: AC
  Administered 2023-08-13: 4 mg via INTRAVENOUS
  Filled 2023-08-13: qty 2

## 2023-08-13 MED ORDER — PROMETHAZINE HCL 6.25 MG/5ML PO SOLN: 12.5000 mg | Freq: Four times a day (QID) | ORAL | Status: DC | PRN

## 2023-08-13 NOTE — ED Notes (Signed)
 Patient back transported from CT

## 2023-08-13 NOTE — ED Notes (Signed)
 Messaged provider about pt's sugar.

## 2023-08-13 NOTE — Assessment & Plan Note (Addendum)
 Creatinine markedly elevated at 12.37, from baseline CKD stage IV.  Last creatinine checked 3 months ago creatinine was 3.16.  Likely secondary to severe dehydration, hypovolemia from bowel obstruction.  Also on ARB's. -Hydrate - Pls consult nephrology in a.m. -Hold losartan 100mg  daily

## 2023-08-13 NOTE — Assessment & Plan Note (Deleted)
 Hypoglycemic blood glucose down to 54, likely from small bowel obstruction. - CBG monitoring - Hold 70 3025 units twice daily - Hgba1c

## 2023-08-13 NOTE — ED Notes (Signed)
 Messaged provider about pt's NG tube output

## 2023-08-13 NOTE — Assessment & Plan Note (Signed)
 Blood pressure down to 71/55, improved with IV fluids to > 100 systolic.  Hypotension likely due to severe hypovolemia. -2 L bolus given, -Continue LR 100cc/hr x 1 day

## 2023-08-13 NOTE — H&P (Signed)
 History and Physical    [name]:[id number] DOB: Mar 28, 1955 DOA: 08/13/2023  [facility]   Patient coming from: Home  I have personally briefly reviewed patient's old medical records in [facility]  Chief Complaint: Abdominal Pain  HPI: Joseph Conrad is a 68 y.o. male with medical history significant for chronic kidney disease, diabetes mellitus, hypertension, OSA. Patient presented to the ED with complaints of abdominal pain, weakness and vomiting. Patient reports symptoms started 4 days ago- 6/7 with vomiting, he reports large amount of vomiting, and reports he might have aspirated at some point.  He reports onset of abdominal pain with bloating the next day.  Reports hypotension, reduced oral intake. He denies prior surgeries.  Reports he has had colonoscopy in the past. Denies difficulty breathing, no cough.  Patient sisters at bedside, she reports that her mother passed away a month ago.  She feels patient has been depressed, she is unsure but she thinks he might have been using illicit drugs like cocaine.  EMS was at his place 2 days ago but he refused to come to the ED.  ED Course: Temperature 97.4.  Heart rate 80s.  Respirate rate 19-24.  O2 sats 91% on room air on my evaluation.  Blood pressure systolic down to 08/65 improved to 101/67 with IV fluids. Creatinine markedly elevated at 12.37 from baseline CKD 4.  Lactic acidosis 3.5 >> 2.2.  CBG- 66. EDP talked to general surgeon Dr. Collene Dawson, will see in consult in AM.  NG tube placed.  Review of Systems: As per HPI all other systems reviewed and negative.  Past Medical History:  Diagnosis Date   Diabetes mellitus without complication (HCC)    IDDM   Hypertension    Shortness of breath dyspnea    with exertion   Umbilical hernia     Past Surgical History:  Procedure Laterality Date   BIOPSY  12/02/2019   Procedure: BIOPSY;  Surgeon: Ruby Corporal, MD;  [facility];  Service: Endoscopy;;   CERVICAL FUSION     COLONOSCOPY N/A 12/02/2019   Procedure: COLONOSCOPY;  Surgeon: Ruby Corporal, MD;  Location: [facility];  Service: Endoscopy;  Laterality: N/A;  830   ORIF PROXIMAL TIBIAL PLATEAU FRACTURE Left    UMBILICAL HERNIA REPAIR N/A 07/27/2015   Procedure: HERNIA REPAIR UMBILICAL ADULT;  Surgeon: Dareen Ebbing, MD;  Location: [facility];  Service: General;  Laterality: N/A;     reports that he has never smoked. He has never used smokeless tobacco. He reports that he does not drink alcohol and does not use drugs.  No Known Allergies  History of hypertension.  Prior to Admission medications   Medication Sig Start Date End Date Taking? Authorizing Provider  amLODipine (NORVASC) 5 MG tablet Take 5 mg by mouth daily.   Yes [provider]  aspirin  325 MG tablet Take 325 mg by mouth in the morning and at bedtime. 12/02/19  Yes Rehman, Mathews Solomons, MD  carvedilol  (COREG ) 25 MG tablet Take 0.5 tablets (12.5 mg total) by mouth 2 (two) times daily with a meal. Patient taking differently: Take 25 mg by mouth 2 (two) times daily with a meal. 02/13/19  Yes Doroteo Gasmen, MD  Cholecalciferol (VITAMIN D) 50 MCG (2000 UT) CAPS Take 2,000 Units by mouth daily.    Yes [provider]  ferrous sulfate 325 (65 FE) MG EC tablet Take 325 mg by mouth every Monday, Wednesday,  and Friday.   Yes [provider]  insulin  aspart protamine- aspart (NOVOLOG  MIX 70/30) (70-30) 100 UNIT/ML injection Inject 25 Units into the skin 2 (two) times daily with a meal.   Yes [provider]  losartan (COZAAR) 100 MG tablet Take 100 mg by mouth daily. 05/08/22  Yes [provider]  naloxone  (NARCAN ) nasal spray 4 mg/0.1 mL In the event of a narcotic overdose, spray into nostril and call 911. 05/03/23  Yes Iva Mariner, MD  Semaglutide,0.25 or 0.5MG /DOS, 2 MG/3ML SOPN Inject 0.25 mg into the skin every Monday. 07/03/22  Yes  [provider]  simvastatin (ZOCOR) 20 MG tablet Take 20 mg by mouth at bedtime.   Yes [provider]    Physical Exam: Vitals:   08/13/23 1930 08/13/23 1940 08/13/23 2000 08/13/23 2120  BP: (!) 82/56 92/69 (!) 94/58 111/67  Pulse:  87 83 82  Resp: (!) 23 (!) 22 (!) 22 19  Temp:   (!) 97.4 F (36.3 C)   TempSrc:   Oral   SpO2:  96% 95% 92%  Weight:   119.7 kg   Height:   5' 8 (1.727 m)     Constitutional: Foul smell in room from NG tube drainage , calm, comfortable Vitals:   08/13/23 1930 08/13/23 1940 08/13/23 2000 08/13/23 2120  BP: (!) 82/56 92/69 (!) 94/58 111/67  Pulse:  87 83 82  Resp: (!) 23 (!) 22 (!) 22 19  Temp:   (!) 97.4 F (36.3 C)   TempSrc:   Oral   SpO2:  96% 95% 92%  Weight:   119.7 kg   Height:   5' 8 (1.727 m)    Eyes: PERRL, lids and conjunctivae normal ENMT: Mucous membranes are moist.  Neck: normal, supple, no masses, no thyromegaly Respiratory: clear to auscultation bilaterally, no wheezing, no crackles. Normal respiratory effort. No accessory muscle use.  Cardiovascular: Regular rate and rhythm, no murmurs / rubs / gallops. No extremity edema.  Extremities warm. Abdomen: Abdomen non tender, still quite distended, firm, despite NG tube in place, with canister already drained 1700 mL of dark brown foul-smelling liquid.  No tenderness, no masses palpated.  Musculoskeletal: no clubbing / cyanosis. No joint deformity upper and lower extremities.   Skin: no rashes, lesions, ulcers. No induration Neurologic:  No facial symmetry, when extremities spontaneously, speech fluent.  Psychiatric: Normal judgment and insight. Alert and oriented x 3. Normal mood.   Labs on Admission: I have personally reviewed following labs and imaging studies  CBC: Recent Labs  Lab 08/13/23 1858  WBC 7.2  NEUTROABS 4.7  HGB 15.1  HCT 45.9  MCV 85.8  PLT 255   Basic Metabolic Panel: Recent Labs  Lab 08/13/23 1858  NA 142  K 4.1  CL 88*  CO2  24  GLUCOSE 66*  BUN 96*  CREATININE 12.37*  CALCIUM  7.8*   GFR: Estimated Creatinine Clearance: 7.4 mL/min (A) (by C-G formula based on SCr of 12.37 mg/dL (H)). Liver Function Tests: Recent Labs  Lab 08/13/23 1858  AST 18  ALT 25  ALKPHOS 61  BILITOT 0.7  PROT 7.8  ALBUMIN 3.5   Recent Labs  Lab 08/13/23 1858  LIPASE 61*   CBG: Recent Labs  Lab 08/13/23 2228 08/13/23 2230  GLUCAP 54* 58*    Radiological Exams on Admission: DG Abd Portable 1 View Result Date: 08/13/2023 CLINICAL DATA:  NG tube placement EXAM: PORTABLE ABDOMEN - 1 VIEW COMPARISON:  CT today FINDINGS: NG  tube tip is in the stomach. Low lung volumes. Mildly dilated bowel in the upper abdomen. IMPRESSION: NG tube in the stomach. Electronically Signed   By: Janeece Mechanic M.D.   On: 08/13/2023 21:38   CT ABDOMEN PELVIS WO CONTRAST Result Date: 08/13/2023 CLINICAL DATA:  Abdominal pain EXAM: CT ABDOMEN AND PELVIS WITHOUT CONTRAST TECHNIQUE: Multidetector CT imaging of the abdomen and pelvis was performed following the standard protocol without IV contrast. RADIATION DOSE REDUCTION: This exam was performed according to the departmental dose-optimization program which includes automated exposure control, adjustment of the mA and/or kV according to patient size and/or use of iterative reconstruction technique. COMPARISON:  02/10/2019 FINDINGS: Lower chest: Bibasilar airspace opacities favor atelectasis although early infiltrate in the lingula difficult to exclude. No effusions. Hepatobiliary: No focal hepatic abnormality. Gallbladder unremarkable. Pancreas: No focal abnormality or ductal dilatation. Spleen: No focal abnormality.  Normal size. Adrenals/Urinary Tract: No adrenal abnormality. No focal renal abnormality. No stones or hydronephrosis. Urinary bladder is unremarkable. Stomach/Bowel: Stomach and small bowel into the pelvis are dilated and fluid-filled. Distal small bowel loops are decompressed. Findings compatible  with mid to distal small bowel obstruction. Scattered diverticula throughout the colon. No evidence of active diverticulitis. Vascular/Lymphatic: Aortic atherosclerosis. No evidence of aneurysm or adenopathy. Reproductive: No visible focal abnormality. Other: No free fluid or free air. Musculoskeletal: No acute bony abnormality. IMPRESSION: Dilated fluid-filled stomach and small bowel into the pelvis. Distal small bowel is decompressed. Findings compatible with mid to distal partial small bowel obstruction. Bibasilar airspace opacities, likely atelectasis although early infiltrate difficult to exclude in the lingula. Electronically Signed   By: Janeece Mechanic M.D.   On: 08/13/2023 20:45   EKG: Independently reviewed.  Sinus rhythm, rate 88, QTc 497, no significant change from prior.  Assessment/Plan Principal Problem:   SBO (small bowel obstruction) (HCC) Active Problems:   Aspiration pneumonia (HCC)   Hypotension   Acute kidney injury superimposed on CKD (HCC)   Hypoglycemia   Diabetes mellitus (HCC)   Essential hypertension   OSA (obstructive sleep apnea)   Assessment and Plan: * SBO (small bowel obstruction) (HCC) Onset of symptoms 4 days ago.  Nothing large-volume vomitus, abdominal pain and marked distention.  NG tube in place so far has drained at least of foul-smelling dark brown fluid.  Denies prior surgeries.  Colonoscopy 2021 - diverticulosis, 1 polyp and internal hemorrhoids. -NG tube -N.p.o. - EDP talked to Dr. Collene Dawson, will see in consult in a.m. -IV Dilaudid  0.5 every 4 hourly as needed - 2 L bolus given, cont LR 100cc/hr x 1 day  Hypoglycemia Hypoglycemic blood glucose down to 54, likely from small bowel obstruction.  He took his insulin  this morning. - CBG monitoring - Hold 70 30- 25 units twice daily - Hgba1c  Acute kidney injury superimposed on CKD (HCC) Creatinine markedly elevated at 12.37, from baseline CKD stage IV.  Last creatinine checked 3 months ago  creatinine was 3.16.  Likely secondary to severe dehydration, hypovolemia from bowel obstruction.  Also on ARB's. -Hydrate - Pls consult nephrology in a.m. -Hold losartan 100mg  daily  Hypotension Blood pressure down to 71/55, improved with IV fluids to > 100 systolic.  Hypotension likely due to severe hypovolemia. -2 L bolus given, -Continue LR 100cc/hr x 1 day  Aspiration pneumonia (HCC) Likely aspirated from large-volume vomitus.  O2 sats 91% on room air on my evaluation.  CT abdomen and pelvis without contrast-bibasilar airspace opacities likely atelectasis or early infiltrate difficult to exclude in the  lingula.  Hypotension likely secondary to hypovolemia considering marked abdominal distention.  Rules out for sepsis.  WBC 7.2.  Afebrile. -IV ceftriaxone  and doxycycline ( QTc prolonged)  Essential hypertension Presenting with hypotension secondary to hypovolemia. - Hold carvedilol , losartan and Norvasc 5 mg  Prolonged QTc-497. - Check magnesium  DVT prophylaxis: SCDS pending Gen surg eval Code Status:  FULL Family Communication: Sister at bedside. Disposition Plan: > 2 days Consults called:  Gen surgery consulted.  Please consult nephrology in AM. Admission status: Inpt Stepdown I certify that at the point of admission it is my clinical judgment that the patient will require inpatient hospital care spanning beyond 2 midnights from the point of admission due to high intensity of service, high risk for further deterioration and high frequency of surveillance required.   CRITICAL CARE Performed by: Pati Bonine   Total critical care time: 70 minutes  Critical care time was exclusive of separately billable procedures and treating other patients.  Critical care was necessary to treat or prevent imminent or life-threatening deterioration.  Critical care was time spent personally by me on the following activities: development of treatment plan with patient and/or  surrogate as well as nursing, discussions with consultants, evaluation of patient's response to treatment, examination of patient, obtaining history from patient or surrogate, ordering and performing treatments and interventions, ordering and review of laboratory studies, ordering and review of radiographic studies, pulse oximetry and re-evaluation of patient's condition.  Author: Pati Bonine, MD 08/13/2023 12:03 AM  For on call review [email or web address].

## 2023-08-13 NOTE — ED Notes (Signed)
 Admitting provider at bedside.

## 2023-08-13 NOTE — Assessment & Plan Note (Signed)
 Presenting with hypotension secondary to hypovolemia. - Hold carvedilol , losartan and Norvasc 5 mg

## 2023-08-13 NOTE — ED Notes (Signed)
 Patient transported to CT

## 2023-08-13 NOTE — Assessment & Plan Note (Addendum)
 Hypoglycemic blood glucose down to 54, likely from small bowel obstruction.  He took his insulin  this morning. - CBG monitoring - Hold 70 30- 25 units twice daily - Hgba1c

## 2023-08-13 NOTE — Assessment & Plan Note (Addendum)
 Onset of symptoms 4 days ago.  Nothing large-volume vomitus, abdominal pain and marked distention.  NG tube in place so far has drained at least of foul-smelling dark brown fluid.  Denies prior surgeries.  Colonoscopy 2021 - diverticulosis, 1 polyp and internal hemorrhoids. -NG tube -N.p.o. - EDP talked to Dr. Collene Dawson, will see in consult in a.m. -IV Dilaudid  0.5 every 4 hourly as needed - 2 L bolus given, cont LR 100cc/hr x 1 day

## 2023-08-13 NOTE — ED Provider Notes (Signed)
 [place] EMERGENCY DEPARTMENT AT [facility] Provider Note   CSN: [id number] Arrival date & time: 08/13/23  1734     History  No chief complaint on file.   Joseph Conrad is a 68 y.o. male with hypertension, HLD, DM, morbidly obese/OSA not on CPAP and chronic kidney disease, BPH who presents with generalized weakness and hypotension. P/w feeling bad since Saturday with nausea/vomiting, inability to take PO. Had some periumbilical abdominal pain and bloating today. No BMs since Saturday, denies passing gas. No h/o abdominal surgery. Denies f/c, SOB, chest pain, falls/head trauma, urinary sxs, leg swelling.    Past Medical History:  Diagnosis Date   Diabetes mellitus without complication (HCC)    IDDM   Hypertension    Shortness of breath dyspnea    with exertion   Umbilical hernia        Home Medications Prior to Admission medications   Medication Sig Start Date End Date Taking? Authorizing Provider  amLODipine (NORVASC) 5 MG tablet Take 5 mg by mouth daily.    [provider]  aspirin  325 MG tablet Take 325 mg by mouth in the morning and at bedtime. 12/02/19   Rehman, Mathews Solomons, MD  carvedilol  (COREG ) 25 MG tablet Take 0.5 tablets (12.5 mg total) by mouth 2 (two) times daily with a meal. Patient taking differently: Take 25 mg by mouth 2 (two) times daily with a meal. 02/13/19   Doroteo Gasmen, MD  Cholecalciferol (VITAMIN D) 50 MCG (2000 UT) CAPS Take 2,000 Units by mouth daily.     [provider]  cyclobenzaprine (FLEXERIL) 10 MG tablet Take 10 mg by mouth 3 (three) times daily as needed for muscle spasms.    [provider]  docusate sodium (COLACE) 100 MG capsule Take 100 mg by mouth 2 (two) times daily.    [provider]  ferrous sulfate 325 (65 FE) MG EC tablet Take 325 mg by mouth every Monday, Wednesday, and Friday.    [provider]  insulin  aspart protamine- aspart (NOVOLOG  MIX 70/30) (70-30) 100  UNIT/ML injection Inject 50 Units into the skin 2 (two) times daily with a meal.    [provider]  losartan (COZAAR) 100 MG tablet Take 100 mg by mouth daily. 05/08/22   [provider]  naloxone  (NARCAN ) nasal spray 4 mg/0.1 mL In the event of a narcotic overdose, spray into nostril and call 911. 05/03/23   Iva Mariner, MD  oxycodone  (OXY-IR) 5 MG capsule Take 5 mg by mouth every 4 (four) hours as needed for pain.    [provider]  Semaglutide,0.25 or 0.5MG /DOS, 2 MG/3ML SOPN Inject 0.25 mg into the skin every Monday. 07/03/22   [provider]  simvastatin (ZOCOR) 20 MG tablet Take 20 mg by mouth at bedtime.    [provider]      Allergies    Patient has no known allergies.    Review of Systems   Review of Systems A 10 point review of systems was performed and is negative unless otherwise reported in HPI.  Physical Exam Updated Vital Signs BP 121/73   Pulse 88   Temp 98.5 F (36.9 C) (Oral)   Resp 15   Ht 5' 8 (1.727 m)   Wt 111.2 kg   SpO2 93%   BMI 37.28 kg/m  Physical Exam General: acutely ill-appearing male, lying in bed.  HEENT: PERRLA, Sclera anicteric, dry mucous membranes, trachea midline.  Cardiology: RRR, no murmurs/rubs/gallops. BL  radial and DP pulses equal bilaterally.  Resp: Normal respiratory rate and effort. CTAB, no wheezes, rhonchi, crackles.  Abd: Soft, diffusely tender and mildly distended abdomen. No rebound tenderness or guarding.  GU: Deferred. MSK: No peripheral edema or signs of trauma. Extremities without deformity or TTP. Skin: warm, dry. Back: No CVA tenderness Neuro: A&Ox4, CNs II-XII grossly intact. MAEs. Sensation grossly intact.  Psych: Normal mood and affect.   ED Results / Procedures / Treatments   Labs (all labs ordered are listed, but only abnormal results are displayed) Labs Reviewed  CULTURE, BLOOD (ROUTINE X 2)  CULTURE, BLOOD (ROUTINE X 2)  CBC WITH DIFFERENTIAL/PLATELET   COMPREHENSIVE METABOLIC PANEL WITH GFR  LACTIC ACID, PLASMA  LACTIC ACID, PLASMA  URINALYSIS, W/ REFLEX TO CULTURE (INFECTION SUSPECTED)    EKG EKG Interpretation Date/Time:  Wednesday August 13 2023 17:45:41 EDT Ventricular Rate:  88 PR Interval:  148 QRS Duration:  78 QT Interval:  410 QTC Calculation: 497 R Axis:   -21  Text Interpretation: Sinus rhythm Atrial premature complex Borderline left axis deviation Low voltage, precordial leads Borderline prolonged QT interval Confirmed by Annita Kindle [phone]) on 08/13/2023 8:54:06 PM  Radiology CT abd pelvis wo contrast: Dilated fluid-filled stomach and small bowel into the pelvis. Distal small bowel is decompressed. Findings compatible with mid to distal partial small bowel obstruction.   Bibasilar airspace opacities, likely atelectasis although early infiltrate difficult to exclude in the lingula.  Abd XR: NG tube in the stomach.   Procedures .Critical Care  Performed by: Merdis Stalling, MD Authorized by: Merdis Stalling, MD   Critical care provider statement:    Critical care time (minutes):  60   Critical care was necessary to treat or prevent imminent or life-threatening deterioration of the following conditions:  Shock, sepsis, dehydration, metabolic crisis and renal failure   Critical care was time spent personally by me on the following activities:  Development of treatment plan with patient or surrogate, discussions with consultants, evaluation of patient's response to treatment, examination of patient, ordering and review of laboratory studies, ordering and review of radiographic studies, ordering and performing treatments and interventions, pulse oximetry, re-evaluation of patient's condition, review of old charts and obtaining history from patient or surrogate   Care discussed with: admitting provider       Medications Ordered in ED Medications  lactated ringers  bolus 1,000 mL (0 mLs Intravenous Stopped 08/13/23  1902)  ondansetron  (ZOFRAN ) injection 4 mg (4 mg Intravenous Given 08/13/23 1802)  lactated ringers  bolus 1,000 mL (0 mLs Intravenous Stopped 08/13/23 2040)  dextrose  50 % solution 50 mL (50 mLs Intravenous Given 08/13/23 2246)  dextrose  50 % solution (50 mLs Intravenous Given 08/13/23 2256)    ED Course/ Medical Decision Making/ A&P                          Medical Decision Making Amount and/or Complexity of Data Reviewed Labs: ordered. Decision-making details documented in ED Course. Radiology: ordered. Decision-making details documented in ED Course.  Risk Prescription drug management. Decision regarding hospitalization.    This patient presents to the ED for concern of abdominal pain nausea vomiting and decreased p.o. intake, this involves an extensive number of treatment options, and is a complaint that carries with it a high risk of complications and morbidity.  I considered the following differential and admission for this acute, potentially life threatening condition.   MDM:    For DDX for abdominal  pain includes but is not limited to:  Abdominal exam without peritoneal signs. No evidence of acute abdomen at this time.  Consider small bowel obstruction, for acute hepatobiliary disease (including acute cholecystitis or cholangitis), acute pancreatitis (neg lipase), PUD (including gastric perforation), acute appendicitis, vascular catastrophe, diverticulitis.   Hypotension most likely d/t hypovolemia in clinical context but must also consider septic shock -no leukocytosis or any reported fever.  In clinical context, hypovolemia higher likelihood. No CP, no signs of STEMI on EKG or volume overload, lower c/f cardiogenic shock.  Patient's blood pressure does improve after 2 L of fluid.  Hypoglycemic which improved with D10.    In the setting of significantly decreased p.o. intake patient reassuringly does not have any significant electrolyte derangements though does have mild  hypoglycemia as well as acute renal failure with creatinine 12.37.  Patient CT abdomen pelvis does not demonstrate any obstructive uropathy, but does demonstrate a small bowel obstruction. UA negative for infection,  Clinical Course as of 08/19/23 2020  Wed Aug 13, 2023  1942 WBC: 7.2 No leukocytosis  [HN]  1942 Lactic Acid, Venous(!!): 3.5 Receiving fluids [HN]  1942 MAP (mmHg): 65 [HN]  2006 Creatinine(!): 12.37 +ARF [HN]  2007 Glucose(!): 66 Mild hypoglycemia, will give D10 [HN]  2007 Anion gap(!): 30 +AG [HN]  2008 MAP (mmHg): 70 Improving with fluids [HN]  2053 CT ABDOMEN PELVIS WO CONTRAST Dilated fluid-filled stomach and small bowel into the pelvis. Distal small bowel is decompressed. Findings compatible with mid to distal partial small bowel obstruction.  Bibasilar airspace opacities, likely atelectasis although early infiltrate difficult to exclude in the lingula.   [HN]  2054 BP 112/57 [HN]  2054 Consulted to surgery [HN]    Clinical Course User Index [HN] Merdis Stalling, MD    Labs: I Ordered, and personally interpreted labs.  The pertinent results include:  those listed above  Imaging Studies ordered: I ordered imaging studies including CT abd pelvis w/ contrast I independently visualized and interpreted imaging. I agree with the radiologist interpretation  Additional history obtained from chart review, EMS.   Cardiac Monitoring: The patient was maintained on a cardiac monitor.  I personally viewed and interpreted the cardiac monitored which showed an underlying rhythm of: NSR  Reevaluation: After the interventions noted above, I reevaluated the patient and found that they have :improved  Social Determinants of Health: Lives independently  Disposition: Admitted to medicine with surgery following  Co morbidities that complicate the patient evaluation  Past Medical History:  Diagnosis Date   Diabetes mellitus without complication (HCC)    IDDM    Hypertension    Shortness of breath dyspnea    with exertion   Umbilical hernia      Medicines Meds ordered this encounter  Medications   lactated ringers  bolus 1,000 mL    I have reviewed the patients home medicines and have made adjustments as needed  Problem List / ED Course: Problem List Items Addressed This Visit   None Visit Diagnoses       AKI (acute kidney injury) (HCC)    -  Primary     Nausea and vomiting, unspecified vomiting type         Decreased oral intake         Hypovolemic shock (HCC)         Small bowel obstruction (HCC)       Relevant Orders   DG Abd Portable 1V-Small Bowel Obstruction Protocol-initial, 8 hr delay (Completed)  This note was created using dictation software, which may contain spelling or grammatical errors.    Merdis Stalling, MD 08/19/23 2027

## 2023-08-13 NOTE — Assessment & Plan Note (Addendum)
 Likely aspirated from large-volume vomitus.  O2 sats 91% on room air on my evaluation.  CT abdomen and pelvis without contrast-bibasilar airspace opacities likely atelectasis or early infiltrate difficult to exclude in the lingula.  Hypotension likely secondary to hypovolemia considering marked abdominal distention.  Rules out for sepsis.  WBC 7.2.  Afebrile. -IV ceftriaxone  and doxycycline ( QTc prolonged)

## 2023-08-14 ENCOUNTER — Inpatient Hospital Stay (HOSPITAL_COMMUNITY)

## 2023-08-14 DIAGNOSIS — K56609 Unspecified intestinal obstruction, unspecified as to partial versus complete obstruction: Secondary | ICD-10-CM

## 2023-08-14 DIAGNOSIS — N179 Acute kidney failure, unspecified: Secondary | ICD-10-CM | POA: Diagnosis not present

## 2023-08-14 DIAGNOSIS — N189 Chronic kidney disease, unspecified: Secondary | ICD-10-CM | POA: Diagnosis not present

## 2023-08-14 LAB — CBC
HCT: 41.9 % (ref 39.0–52.0)
Hemoglobin: 13.6 g/dL (ref 13.0–17.0)
MCH: 27.2 pg (ref 26.0–34.0)
MCHC: 32.5 g/dL (ref 30.0–36.0)
MCV: 83.8 fL (ref 80.0–100.0)
Platelets: 222 10*3/uL (ref 150–400)
RBC: 5 MIL/uL (ref 4.22–5.81)
RDW: 14.6 % (ref 11.5–15.5)
WBC: 7.7 10*3/uL (ref 4.0–10.5)
nRBC: 0 % (ref 0.0–0.2)

## 2023-08-14 LAB — URINALYSIS, W/ REFLEX TO CULTURE (INFECTION SUSPECTED)
Glucose, UA: NEGATIVE mg/dL
Ketones, ur: NEGATIVE mg/dL
Leukocytes,Ua: NEGATIVE
Nitrite: NEGATIVE
Protein, ur: 30 mg/dL — AB
Specific Gravity, Urine: 1.018 (ref 1.005–1.030)
pH: 5 (ref 5.0–8.0)

## 2023-08-14 LAB — GLUCOSE, CAPILLARY
Glucose-Capillary: 113 mg/dL — ABNORMAL HIGH (ref 70–99)
Glucose-Capillary: 118 mg/dL — ABNORMAL HIGH (ref 70–99)
Glucose-Capillary: 121 mg/dL — ABNORMAL HIGH (ref 70–99)
Glucose-Capillary: 132 mg/dL — ABNORMAL HIGH (ref 70–99)
Glucose-Capillary: 144 mg/dL — ABNORMAL HIGH (ref 70–99)
Glucose-Capillary: 158 mg/dL — ABNORMAL HIGH (ref 70–99)
Glucose-Capillary: 88 mg/dL (ref 70–99)
Glucose-Capillary: 99 mg/dL (ref 70–99)

## 2023-08-14 LAB — BASIC METABOLIC PANEL WITH GFR
Anion gap: 23 — ABNORMAL HIGH (ref 5–15)
BUN: 116 mg/dL — ABNORMAL HIGH (ref 8–23)
CO2: 26 mmol/L (ref 22–32)
Calcium: 6.9 mg/dL — ABNORMAL LOW (ref 8.9–10.3)
Chloride: 89 mmol/L — ABNORMAL LOW (ref 98–111)
Creatinine, Ser: 12.85 mg/dL — ABNORMAL HIGH (ref 0.61–1.24)
GFR, Estimated: 4 mL/min — ABNORMAL LOW (ref >60)
Glucose, Bld: 144 mg/dL — ABNORMAL HIGH (ref 70–99)
Potassium: 3.7 mmol/L (ref 3.5–5.1)
Sodium: 138 mmol/L (ref 135–145)

## 2023-08-14 LAB — HEMOGLOBIN A1C
Hgb A1c MFr Bld: 5.5 % (ref 4.8–5.6)
Mean Plasma Glucose: 111.15 mg/dL

## 2023-08-14 LAB — MAGNESIUM: Magnesium: 2.5 mg/dL — ABNORMAL HIGH (ref 1.7–2.4)

## 2023-08-14 LAB — HIV ANTIBODY (ROUTINE TESTING W REFLEX): HIV Screen 4th Generation wRfx: NONREACTIVE

## 2023-08-14 LAB — RAPID URINE DRUG SCREEN, HOSP PERFORMED
Amphetamines: NOT DETECTED
Barbiturates: NOT DETECTED
Benzodiazepines: NOT DETECTED
Cocaine: POSITIVE — AB
Opiates: NOT DETECTED
Tetrahydrocannabinol: NOT DETECTED

## 2023-08-14 LAB — MRSA NEXT GEN BY PCR, NASAL: MRSA by PCR Next Gen: NOT DETECTED

## 2023-08-14 MED ORDER — LACTATED RINGERS IV SOLN: INTRAVENOUS | Status: AC

## 2023-08-14 MED ORDER — HEPARIN SODIUM (PORCINE) 1000 UNIT/ML IJ SOLN
4000.0000 [IU] | Freq: Once | INTRAMUSCULAR | Status: AC
  Administered 2023-08-14: 4000 [IU]
  Filled 2023-08-14: qty 4

## 2023-08-14 MED ORDER — DEXTROSE-SODIUM CHLORIDE 5-0.45 % IV SOLN: INTRAVENOUS | Status: AC

## 2023-08-14 MED ORDER — ALBUTEROL SULFATE (2.5 MG/3ML) 0.083% IN NEBU: 2.5000 mg | INHALATION_SOLUTION | RESPIRATORY_TRACT | Status: DC | PRN

## 2023-08-14 MED ORDER — SODIUM CHLORIDE 0.9 % IV SOLN
100.0000 mg | Freq: Two times a day (BID) | INTRAVENOUS | Status: AC
  Administered 2023-08-14 – 2023-08-20 (×14): 100 mg via INTRAVENOUS
  Filled 2023-08-14 (×17): qty 100

## 2023-08-14 MED ORDER — LIDOCAINE HCL (PF) 1 % IJ SOLN
10.0000 mL | Freq: Once | INTRAMUSCULAR | Status: AC
  Administered 2023-08-14: 10 mL via INTRADERMAL
  Filled 2023-08-14: qty 10

## 2023-08-14 MED ORDER — SODIUM CHLORIDE 0.9 % IV SOLN
2.0000 g | INTRAVENOUS | Status: AC
  Administered 2023-08-14 – 2023-08-20 (×7): 2 g via INTRAVENOUS
  Filled 2023-08-14 (×7): qty 20

## 2023-08-14 NOTE — Plan of Care (Signed)

## 2023-08-14 NOTE — TOC CM/SW Note (Signed)
 Transition of Care [facility]) - Inpatient Brief Assessment   Patient Details  Name: Joseph Conrad MRN: [id number] Date of Birth: 1955/06/14  Transition of Care [facility]) CM/SW Contact:    Grandville Lax, LCSWA Phone Number: 08/14/2023, 9:33 AM   Clinical Narrative: [facility] notified [facility] hospital admission at this time. [facility] notification ID is [id number].   Transition of Care Department [facility]) has reviewed patient and no TOC needs have been identified at this time. We will continue to monitor patient advancement through interdiciplinary progression rounds. If new patient transition needs arise, please place a TOC consult.  Transition of Care Asessment: Insurance and Status: Insurance coverage has been reviewed Patient has primary care physician: Yes Home environment has been reviewed: From home Prior level of function:: Independent Prior/Current Home Services: No current home services Social Drivers of Health Review: SDOH reviewed no interventions necessary Readmission risk has been reviewed: Yes Transition of care needs: no transition of care needs at this time

## 2023-08-14 NOTE — Progress Notes (Signed)
 [facility]  Updated team. CXR with catheter in good position, no ptx.  Deena Farrier, MD [facility] [address] Anise Barlow [place], [place] [address] [phone] (office)

## 2023-08-14 NOTE — Consult Note (Signed)
 [facility] Consult  Reason for Consult: SBO, acute on chronic renal failure  Referring Physician: Dr. Quintella Buck    HPI: Joseph Conrad is a 68 y.o. male with Dm, HTN, OSA, CKD with AKI who comes in with 1 week of nausea/vomiting and weakness. CT scan demonstrated concern for a partial SBO and he was admitted with NG and NPO after the ED called me last night. His Creatinine baseline is usually 2-3 and he is 12 on lab work.  His NG output has been about 2L. He denies any prior SBO. He had an open umbilical hernia repair closed primarily with Dr. Eli Grizzle in 2017. He denies BM since Saturday. He has had some minor smears but nothing substantial.   Past Medical History:  Diagnosis Date   Diabetes mellitus without complication (HCC)    IDDM   Hypertension    Shortness of breath dyspnea    with exertion   Umbilical hernia     Past Surgical History:  Procedure Laterality Date   BIOPSY  12/02/2019   Procedure: BIOPSY;  Surgeon: Ruby Corporal, MD;  Location: [facility];  Service: Endoscopy;;   CERVICAL FUSION     COLONOSCOPY N/A 12/02/2019   Procedure: COLONOSCOPY;  Surgeon: Ruby Corporal, MD;  [facility];  Service: Endoscopy;  Laterality: N/A;  830   ORIF PROXIMAL TIBIAL PLATEAU FRACTURE Left    UMBILICAL HERNIA REPAIR N/A 07/27/2015   Procedure: HERNIA REPAIR UMBILICAL ADULT;  Surgeon: Dareen Ebbing, MD;  Location: [facility];  Service: General;  Laterality: N/A;    No family history on file.  Social History   Tobacco Use   Smoking status: Never   Smokeless tobacco: Never  Substance Use Topics   Alcohol use: No   Drug use: No    Medications: I have reviewed the patient's current medications. Prior to Admission:  Medications Prior to Admission  Medication Sig Dispense Refill Last Dose/Taking   amLODipine (NORVASC) 5 MG tablet Take 5 mg by mouth daily.   08/13/2023 Morning   aspirin  325 MG tablet Take 325 mg by mouth in the  morning and at bedtime. 30 tablet  08/13/2023 at  8:30 AM   carvedilol  (COREG ) 25 MG tablet Take 0.5 tablets (12.5 mg total) by mouth 2 (two) times daily with a meal. (Patient taking differently: Take 25 mg by mouth 2 (two) times daily with a meal.) 60 tablet 0 08/13/2023 Morning   Cholecalciferol (VITAMIN D) 50 MCG (2000 UT) CAPS Take 2,000 Units by mouth daily.    08/13/2023 Morning   ferrous sulfate 325 (65 FE) MG EC tablet Take 325 mg by mouth every Monday, Wednesday, and Friday.   08/13/2023 Morning   insulin  aspart protamine- aspart (NOVOLOG  MIX 70/30) (70-30) 100 UNIT/ML injection Inject 25 Units into the skin 2 (two) times daily with a meal.   08/13/2023 Morning   losartan (COZAAR) 100 MG tablet Take 100 mg by mouth daily.   08/13/2023 Morning   naloxone  (NARCAN ) nasal spray 4 mg/0.1 mL In the event of a narcotic overdose, spray into nostril and call 911. 1 each 0 Unknown   Semaglutide,0.25 or 0.5MG /DOS, 2 MG/3ML SOPN Inject 0.25 mg into the skin every Monday.   08/11/2023   simvastatin (ZOCOR) 20 MG tablet Take 20 mg by mouth at bedtime.   08/12/2023 Bedtime   Scheduled:  Chlorhexidine  Gluconate Cloth  6 each Topical Daily   Continuous:  cefTRIAXone  (ROCEPHIN )  IV 2 g (08/14/23 0027)  dextrose  5 % and 0.45 % NaCl 100 mL/hr at 08/14/23 0847   doxycycline (VIBRAMYCIN) IV 100 mg (08/14/23 1245)   lactated ringers  41 mL/hr at 08/14/23 1610   PRN:acetaminophen  **OR** acetaminophen , HYDROmorphone  (DILAUDID ) injection, promethazine  No Known Allergies   ROS:  A comprehensive review of systems was negative except for: Gastrointestinal: positive for abdominal pain, constipation, nausea, and vomiting Genitourinary: positive for no urination   Blood pressure 129/67, pulse 96, temperature 98.4 F (36.9 C), temperature source Oral, resp. rate 16, height 5' 8 (1.727 m), weight 111.1 kg, SpO2 96%. Physical Exam Vitals reviewed.  Constitutional:      Appearance: He is obese.  HENT:     Head:  Normocephalic.     Nose: Nose normal.   Eyes:     Extraocular Movements: Extraocular movements intact.    Cardiovascular:     Rate and Rhythm: Normal rate.  Pulmonary:     Effort: Pulmonary effort is normal.  Abdominal:     General: There is no distension.     Palpations: Abdomen is soft.     Tenderness: There is abdominal tenderness.     Comments: Mildly tender, no rebound or guarding, rotund but does not seem distended.    Skin:    General: Skin is warm.   Neurological:     General: No focal deficit present.     Mental Status: He is alert and oriented to person, place, and time.   Psychiatric:        Mood and Affect: Mood normal.        Behavior: Behavior normal.     Results: Results for orders placed or performed during the hospital encounter of 08/13/23 (from the past 48 hours)  CBC with Differential     Status: Abnormal   Collection Time: 08/13/23  6:58 PM  Result Value Ref Range   WBC 7.2 4.0 - 10.5 K/uL   RBC 5.35 4.22 - 5.81 MIL/uL   Hemoglobin 15.1 13.0 - 17.0 g/dL   HCT 96.0 45.4 - 09.8 %   MCV 85.8 80.0 - 100.0 fL   MCH 28.2 26.0 - 34.0 pg   MCHC 32.9 30.0 - 36.0 g/dL   RDW 11.9 14.7 - 82.9 %   Platelets 255 150 - 400 K/uL   nRBC 0.0 0.0 - 0.2 %   Neutrophils Relative % 66 %   Neutro Abs 4.7 1.7 - 7.7 K/uL   Lymphocytes Relative 18 %   Lymphs Abs 1.3 0.7 - 4.0 K/uL   Monocytes Relative 15 %   Monocytes Absolute 1.1 (H) 0.1 - 1.0 K/uL   Eosinophils Relative 0 %   Eosinophils Absolute 0.0 0.0 - 0.5 K/uL   Basophils Relative 0 %   Basophils Absolute 0.0 0.0 - 0.1 K/uL   Immature Granulocytes 1 %   Abs Immature Granulocytes 0.05 0.00 - 0.07 K/uL    Comment: Performed at [facility], [address] [place], [place] [address]  Comprehensive metabolic panel     Status: Abnormal   Collection Time: 08/13/23  6:58 PM  Result Value Ref Range   Sodium 142 135 - 145 mmol/L   Potassium 4.1 3.5 - 5.1 mmol/L   Chloride 88 (L) 98 - 111 mmol/L   CO2 24 22 -  32 mmol/L   Glucose, Bld 66 (L) 70 - 99 mg/dL    Comment: Glucose reference range applies only to samples taken after fasting for at least 8 hours.   BUN 96 (H) 8 -  23 mg/dL    Comment: RESULTS CONFIRMED BY MANUAL DILUTION   Creatinine, Ser 12.37 (H) 0.61 - 1.24 mg/dL   Calcium  7.8 (L) 8.9 - 10.3 mg/dL   Total Protein 7.8 6.5 - 8.1 g/dL   Albumin 3.5 3.5 - 5.0 g/dL   AST 18 15 - 41 U/L   ALT 25 0 - 44 U/L   Alkaline Phosphatase 61 38 - 126 U/L   Total Bilirubin 0.7 0.0 - 1.2 mg/dL   GFR, Estimated 4 (L) >60 mL/min    Comment: (NOTE) Calculated using the CKD-EPI Creatinine Equation (2021)    Anion gap 30 (H) 5 - 15    Comment: Electrolytes repeated to confirm. Electrolytes repeated to confirm. Performed at [facility], [address], [place], [place] [address]   Lactic acid, plasma     Status: Abnormal   Collection Time: 08/13/23  6:58 PM  Result Value Ref Range   Lactic Acid, Venous 3.5 (HH) 0.5 - 1.9 mmol/L    Comment: CRITICAL RESULT CALLED TO, READ BACK BY AND VERIFIED WITH NICHOLS,K ON 08/13/23 AT 1940 BY LOY,C Performed at [facility], [address], [place], [place] [address]   Blood culture (routine x 2)     Status: None (Preliminary result)   Collection Time: 08/13/23  6:58 PM   Specimen: BLOOD  Result Value Ref Range   Specimen Description BLOOD RIGHT ANTECUBITAL    Special Requests      BOTTLES DRAWN AEROBIC AND ANAEROBIC Blood Culture adequate volume   Culture      NO GROWTH < 12 HOURS Performed at [facility], [address], [place], [place] [address]    Report Status PENDING   Blood culture (routine x 2)     Status: None (Preliminary result)   Collection Time: 08/13/23  6:58 PM   Specimen: BLOOD  Result Value Ref Range   Specimen Description BLOOD LEFT ANTECUBITAL    Special Requests      BOTTLES DRAWN AEROBIC ONLY Blood Culture results may not be optimal due to an inadequate volume of blood received in culture bottles   Culture      NO GROWTH < 12  HOURS Performed at [facility], [address] [place], [place] [address]    Report Status PENDING   Lipase, blood     Status: Abnormal   Collection Time: 08/13/23  6:58 PM  Result Value Ref Range   Lipase 61 (H) 11 - 51 U/L    Comment: Performed at [facility], [address], [place], [place] [address]  Lactic acid, plasma     Status: Abnormal   Collection Time: 08/13/23  8:39 PM  Result Value Ref Range   Lactic Acid, Venous 2.2 (HH) 0.5 - 1.9 mmol/L    Comment: CRITICAL RESULT CALLED TO, READ BACK BY AND VERIFIED WITH K PICKUREL [id number] Rufino Coulter Performed at [facility], [address], [place], [place] [address]   CBG monitoring, ED     Status: Abnormal   Collection Time: 08/13/23 10:28 PM  Result Value Ref Range   Glucose-Capillary 54 (L) 70 - 99 mg/dL    Comment: Glucose reference range applies only to samples taken after fasting for at least 8 hours.  CBG monitoring, ED     Status: Abnormal   Collection Time: 08/13/23 10:30 PM  Result Value Ref Range   Glucose-Capillary 58 (L) 70 - 99 mg/dL    Comment: Glucose reference range applies only to samples taken after fasting for  at least 8 hours.  Glucose, capillary     Status: None   Collection Time: 08/13/23 11:20 PM  Result Value Ref Range   Glucose-Capillary 99 70 - 99 mg/dL    Comment: Glucose reference range applies only to samples taken after fasting for at least 8 hours.  MRSA Next Gen by PCR, Nasal     Status: None   Collection Time: 08/13/23 11:36 PM   Specimen: Nasal Mucosa; Nasal Swab  Result Value Ref Range   MRSA by PCR Next Gen NOT DETECTED NOT DETECTED    Comment: (NOTE) The GeneXpert MRSA Assay (FDA approved for NASAL specimens only), is one component of a comprehensive MRSA colonization surveillance program. It is not intended to diagnose MRSA infection nor to guide or monitor treatment for MRSA infections. Test performance is not FDA approved in patients less than 17 years old. Performed at [facility], [address], [place], [place] [address]   Glucose, capillary     Status: Abnormal   Collection Time: 08/14/23  1:21 AM  Result Value Ref Range   Glucose-Capillary 113 (H) 70 - 99 mg/dL    Comment: Glucose reference range applies only to samples taken after fasting for at least 8 hours.  Glucose, capillary     Status: Abnormal   Collection Time: 08/14/23  4:16 AM  Result Value Ref Range   Glucose-Capillary 144 (H) 70 - 99 mg/dL    Comment: Glucose reference range applies only to samples taken after fasting for at least 8 hours.  Hemoglobin A1c     Status: None   Collection Time: 08/14/23  4:42 AM  Result Value Ref Range   Hgb A1c MFr Bld 5.5 4.8 - 5.6 %    Comment: (NOTE) Diagnosis of Diabetes The following HbA1c ranges recommended by the American Diabetes Association (ADA) may be used as an aid in the diagnosis of diabetes mellitus.  Hemoglobin             Suggested A1C NGSP%              Diagnosis  <5.7                   Non Diabetic  5.7-6.4                Pre-Diabetic  >6.4                   Diabetic  <7.0                   Glycemic control for                       adults with diabetes.     Mean Plasma Glucose 111.15 mg/dL    Comment: Performed at [facility], [address] [place], [place] [address]  Magnesium     Status: Abnormal   Collection Time: 08/14/23  4:42 AM  Result Value Ref Range   Magnesium 2.5 (H) 1.7 - 2.4 mg/dL    Comment: Performed at [facility], [address] [place], [place] [address]  HIV Antibody (routine testing w rflx)     Status: None   Collection Time: 08/14/23  4:42 AM  Result Value Ref Range   HIV Screen 4th Generation wRfx Non Reactive Non Reactive    Comment: Performed at [facility], [address] [place], [place] [address]  Basic metabolic panel  Status: Abnormal   Collection Time: 08/14/23  4:42 AM  Result Value Ref Range   Sodium 138 135 - 145 mmol/L   Potassium 3.7 3.5 - 5.1 mmol/L   Chloride  89 (L) 98 - 111 mmol/L   CO2 26 22 - 32 mmol/L   Glucose, Bld 144 (H) 70 - 99 mg/dL    Comment: Glucose reference range applies only to samples taken after fasting for at least 8 hours.   BUN 116 (H) 8 - 23 mg/dL    Comment: RESULT CONFIRMED BY MANUAL DILUTION   Creatinine, Ser 12.85 (H) 0.61 - 1.24 mg/dL   Calcium  6.9 (L) 8.9 - 10.3 mg/dL   GFR, Estimated 4 (L) >60 mL/min    Comment: (NOTE) Calculated using the CKD-EPI Creatinine Equation (2021)    Anion gap 23 (H) 5 - 15    Comment: ELECTROLYTES REPEATED TO VERIFY Performed at [facility], [address], [place], [place] [address]   CBC     Status: None   Collection Time: 08/14/23  4:42 AM  Result Value Ref Range   WBC 7.7 4.0 - 10.5 K/uL   RBC 5.00 4.22 - 5.81 MIL/uL   Hemoglobin 13.6 13.0 - 17.0 g/dL   HCT 46.9 62.9 - 52.8 %   MCV 83.8 80.0 - 100.0 fL   MCH 27.2 26.0 - 34.0 pg   MCHC 32.5 30.0 - 36.0 g/dL   RDW 41.3 24.4 - 01.0 %   Platelets 222 150 - 400 K/uL   nRBC 0.0 0.0 - 0.2 %    Comment: Performed at [facility], [address] [place], [place] [address]  Glucose, capillary     Status: Abnormal   Collection Time: 08/14/23  6:13 AM  Result Value Ref Range   Glucose-Capillary 118 (H) 70 - 99 mg/dL    Comment: Glucose reference range applies only to samples taken after fasting for at least 8 hours.  Glucose, capillary     Status: None   Collection Time: 08/14/23  7:43 AM  Result Value Ref Range   Glucose-Capillary 88 70 - 99 mg/dL    Comment: Glucose reference range applies only to samples taken after fasting for at least 8 hours.  Glucose, capillary     Status: Abnormal   Collection Time: 08/14/23 11:58 AM  Result Value Ref Range   Glucose-Capillary 121 (H) 70 - 99 mg/dL    Comment: Glucose reference range applies only to samples taken after fasting for at least 8 hours.   Comment 1 Notify RN    Comment 2 Document in Chart    Personally reviewed and showed the patient and family- dilated loops of small  bowel, more gradual transition down to decompressed bowel distal, no abort transition point noted  DG Abd Portable 1 View Result Date: 08/13/2023 CLINICAL DATA:  NG tube placement EXAM: PORTABLE ABDOMEN - 1 VIEW COMPARISON:  CT today FINDINGS: NG tube tip is in the stomach. Low lung volumes. Mildly dilated bowel in the upper abdomen. IMPRESSION: NG tube in the stomach. Electronically Signed   By: Janeece Mechanic M.D.   On: 08/13/2023 21:38   CT ABDOMEN PELVIS WO CONTRAST Result Date: 08/13/2023 CLINICAL DATA:  Abdominal pain EXAM: CT ABDOMEN AND PELVIS WITHOUT CONTRAST TECHNIQUE: Multidetector CT imaging of the abdomen and pelvis was performed following the standard protocol without IV contrast. RADIATION DOSE REDUCTION: This exam was performed according to the departmental dose-optimization program which includes automated exposure control, adjustment of the mA and/or kV  according to patient size and/or use of iterative reconstruction technique. COMPARISON:  02/10/2019 FINDINGS: Lower chest: Bibasilar airspace opacities favor atelectasis although early infiltrate in the lingula difficult to exclude. No effusions. Hepatobiliary: No focal hepatic abnormality. Gallbladder unremarkable. Pancreas: No focal abnormality or ductal dilatation. Spleen: No focal abnormality.  Normal size. Adrenals/Urinary Tract: No adrenal abnormality. No focal renal abnormality. No stones or hydronephrosis. Urinary bladder is unremarkable. Stomach/Bowel: Stomach and small bowel into the pelvis are dilated and fluid-filled. Distal small bowel loops are decompressed. Findings compatible with mid to distal small bowel obstruction. Scattered diverticula throughout the colon. No evidence of active diverticulitis. Vascular/Lymphatic: Aortic atherosclerosis. No evidence of aneurysm or adenopathy. Reproductive: No visible focal abnormality. Other: No free fluid or free air. Musculoskeletal: No acute bony abnormality. IMPRESSION: Dilated  fluid-filled stomach and small bowel into the pelvis. Distal small bowel is decompressed. Findings compatible with mid to distal partial small bowel obstruction. Bibasilar airspace opacities, likely atelectasis although early infiltrate difficult to exclude in the lingula. Electronically Signed   By: Janeece Mechanic M.D.   On: 08/13/2023 20:45     Assessment & Plan:  AZHAR KNOPE is a 68 y.o. male with SBO that looks more partial and could be more related to an enteritis or other issues given that I see no discrete transition point.  He also has AKI and I have been asked to place a dialysis catheter.   We discussed NPO, NG and plan to let things settle out. Monitor bowel function.  Discussed dialysis line placement, risk of bleeding, infection, injury to vessels, malfunction, need to transition to a tunneled catheter if he needs more long term dialysis, pneumothorax.    All questions were answered to the satisfaction of the patient and family. Updated team.    Awilda Bogus 08/14/2023, 4:05 PM

## 2023-08-14 NOTE — Procedures (Signed)
 Procedure Note  08/14/23    Preoperative Diagnosis: Acute on chronic renal failure    Postoperative Diagnosis: Same   Procedure(s) Performed: Trialysis catheter placement right internal jugular    Surgeon: Dixon Fredrickson. Collene Dawson, MD   Assistants: None   Anesthesia: 1% lidocaine     Complications: None    Indications: Mr. Zarrella is a 68 y.o. with acute on chronic renal failure that needs dialysis. I discussed the risk and benefits of placement of the central line with him and his family, including but not limited to bleeding, infection, and risk of pneumothorax. He has given consent for the procedure.    Procedure: The patient placed supine. The right chest and neck was prepped and draped in the usual sterile fashion.  Wearing full gown and gloves, I performed the procedure.  One percent lidocaine  was used for local anesthesia. An ultrasound was utilized to assess the jugular vein.  The needle with syringe was advanced into the vein with dark venous return, and a wire was placed using the Seldinger technique without difficulty.  Ectopia was not noted.  The skin was knicked and a dilator was placed, and the three lumen trialysis catheter was placed over the wire with continued control of the wire.  There was good draw back of blood from all three lumens and each flushed easily with saline.  The catheter was secured in 3 points with 2-0 Nylon and a biopatch and dressing was placed.   The dialysis lumens were packed with heparin.  The patient tolerated the procedure well, and the CXR was ordered to confirm position of the dialysis line.   Deena Farrier, MD [facility] [address] [place], [place] [address] [phone] (office)

## 2023-08-14 NOTE — Progress Notes (Addendum)
 PROGRESS NOTE  Joseph Conrad, is a 68 y.o. male, DOB - 08/26/1955, ZOX:[id number]  Admit date - 08/13/2023   Admitting Physician Joseph Debroah Fanning, MD  Outpatient Primary MD for the patient is [facility], [facility]  LOS - 1  CC---no urine       Brief Narrative:  68 y.o. male with medical history significant for CKD IV, diabetes mellitus, hypertension, OSA admitted on 08/13/2023 with AKI on CKD in the setting of SBO    -Assessment and Plan: 1)SBO (small bowel obstruction) --POA - Gust with general surgeon Dr. Collene Conrad - Recommends continue NG tube and conservative approach for now - Patient denies prior surgeries.  Colonoscopy 2021 - diverticulosis, 1 polyp and internal hemorrhoids. - Continue IV fluids while awaiting return of bowel function and tolerance of oral intake  2)Persistent Hypoglycemia--- due to #1 above -Hold PTA insulin  - Blood glucose down to the 50s - Continuous IV dextrose  drip for now  3)AKI----acute kidney injury on CKD stage - IV--in the setting of dehydration with volume depletion and hypotension due to #1, compounded by losartan use PTA -  creatinine on admission= 12.37 - baseline creatinine =  3.1 (05/03/23)  ,  creatinine is now= 12.85 ([place]) , -Continue IV fluids -Hold PTA losartan - renally adjust medications, avoid nephrotoxic agents / dehydration  / hypotension - Status post right IJ temporary catheter placement on 08/14/2023 for possible HD treatments - Nephrology input appreciated  4)Aspiration pneumonia/CAP---POA - Suspect due to #1 above with persistent emesis - Continue Rocephin /Doxy - prn bronchodilators - Supplemental oxygen as needed  5)HTN--now hypotensive due to volume depletion in setting of 1 above - Hold carvedilol , losartan and Norvasc 5 mg  Status is: Inpatient   Disposition: The patient is from: Home              Anticipated d/c is to: Home              Anticipated d/c date is: > 3 days              Patient  currently is not medically stable to d/c. Barriers: Not Clinically Stable-   Code Status :  -  Code Status: Full Code   Family Communication:    (patient is alert, awake and coherent)  Discussed with S/o Joseph Conrad at bedside  DVT Prophylaxis  :   - SCDs   SCDs Start: 08/13/23 2359   Lab Results  Component Value Date   PLT 222 08/14/2023    Inpatient Medications  Scheduled Meds:  Chlorhexidine  Gluconate Cloth  6 each Topical Daily   Continuous Infusions:  cefTRIAXone  (ROCEPHIN )  IV 2 g (08/14/23 0027)   dextrose  5 % and 0.45 % NaCl 100 mL/hr at 08/14/23 1636   doxycycline (VIBRAMYCIN) IV Stopped (08/14/23 1500)   lactated ringers  41 mL/hr at 08/14/23 1636   PRN Meds:.acetaminophen  **OR** acetaminophen , HYDROmorphone  (DILAUDID ) injection, promethazine   Anti-infectives (From admission, onward)    Start     Dose/Rate Route Frequency Ordered Stop   08/14/23 0100  cefTRIAXone  (ROCEPHIN ) 2 g in sodium chloride  0.9 % 100 mL IVPB        2 g 200 mL/hr over 30 Minutes Intravenous Every 24 hours 08/14/23 0000     08/14/23 0100  doxycycline (VIBRAMYCIN) 100 mg in sodium chloride  0.9 % 250 mL IVPB        100 mg 125 mL/hr over 120 Minutes Intravenous Every 12 hours 08/14/23 0000  Subjective: Joseph Conrad today has no fevers, no emesis,  No chest pain,    - No further emesis with NG in situ - Significant other Ms. Joseph Conrad at bedside - Poor urine output  Objective: Vitals:   08/14/23 1300 08/14/23 1400 08/14/23 1600 08/14/23 1603  BP: 113/68 129/67 (!) 123/54 (!) 123/54  Pulse: 98 96 95 96  Resp: 18 16 17 14   Temp:    98.4 F (36.9 C)  TempSrc:    Oral  SpO2: 95% 96% 94% 96%  Weight:      Height:        Intake/Output Summary (Last 24 hours) at 08/14/2023 1808 Last data filed at 08/14/2023 1636 Gross per 24 hour  Intake 3414.01 ml  Output 1900 ml  Net 1514.01 ml   Filed Weights   08/13/23 2000 08/13/23 2300  Weight: 119.7 kg 111.1 kg    Physical  Exam  Gen:- Awake Alert,  in no apparent distress  HEENT:- [place].AT, No sclera icterus Nose-NG tube with gastric contents Neck-Supple Neck, right IJ HD catheter Lungs-  CTAB , fair symmetrical air movement CV- S1, S2 normal, regular  Abd-  +ve B.Sounds, Abd Soft, +ve less abdominal distention Extremity/Skin:- No  edema, pedal pulses present  Psych-affect is appropriate, oriented x3 Neuro-no new focal deficits, no tremors  Data Reviewed: I have personally reviewed following labs and imaging studies  CBC: Recent Labs  Lab 08/13/23 1858 08/14/23 0442  WBC 7.2 7.7  NEUTROABS 4.7  --   HGB 15.1 13.6  HCT 45.9 41.9  MCV 85.8 83.8  PLT 255 222   Basic Metabolic Panel: Recent Labs  Lab 08/13/23 1858 08/14/23 0442  NA 142 138  K 4.1 3.7  CL 88* 89*  CO2 24 26  GLUCOSE 66* 144*  BUN 96* 116*  CREATININE 12.37* 12.85*  CALCIUM  7.8* 6.9*  MG  --  2.5*   GFR: Estimated Creatinine Clearance: 6.8 mL/min (A) (by C-G formula based on SCr of 12.85 mg/dL (H)). Liver Function Tests: Recent Labs  Lab 08/13/23 1858  AST 18  ALT 25  ALKPHOS 61  BILITOT 0.7  PROT 7.8  ALBUMIN 3.5   HbA1C: Recent Labs    08/14/23 0442  HGBA1C 5.5    Recent Results (from the past 240 hours)  Blood culture (routine x 2)     Status: None (Preliminary result)   Collection Time: 08/13/23  6:58 PM   Specimen: BLOOD  Result Value Ref Range Status   Specimen Description BLOOD RIGHT ANTECUBITAL  Final   Special Requests   Final    BOTTLES DRAWN AEROBIC AND ANAEROBIC Blood Culture adequate volume   Culture   Final    NO GROWTH < 12 HOURS Performed at [facility], [address], [place], [place] [address]    Report Status PENDING  Incomplete  Blood culture (routine x 2)     Status: None (Preliminary result)   Collection Time: 08/13/23  6:58 PM   Specimen: BLOOD  Result Value Ref Range Status   Specimen Description BLOOD LEFT ANTECUBITAL  Final   Special Requests   Final    BOTTLES DRAWN  AEROBIC ONLY Blood Culture results may not be optimal due to an inadequate volume of blood received in culture bottles   Culture   Final    NO GROWTH < 12 HOURS Performed at [facility], [address] [place], [place] [address]    Report Status PENDING  Incomplete  MRSA Next Gen by PCR, Nasal  Status: None   Collection Time: 08/13/23 11:36 PM   Specimen: Nasal Mucosa; Nasal Swab  Result Value Ref Range Status   MRSA by PCR Next Gen NOT DETECTED NOT DETECTED Final    Comment: (NOTE) The GeneXpert MRSA Assay (FDA approved for NASAL specimens only), is one component of a comprehensive MRSA colonization surveillance program. It is not intended to diagnose MRSA infection nor to guide or monitor treatment for MRSA infections. Test performance is not FDA approved in patients less than 62 years old. Performed at [facility], [address], [place], [place] [address]       Radiology Studies: [facility] Chest [facility] 1 View Result Date: 08/14/2023 CLINICAL DATA:  Dialysis catheter placement EXAM: PORTABLE CHEST 1 VIEW COMPARISON:  January nineteen two FINDINGS: Right IJ dialysis catheter tip in the cavoatrial junction. No pneumothorax. Heart and mediastinum normal without significant infiltrates. Minimal left lower lobe hypoventilatory atelectatic changes Nasogastric tube in the body of the stomach. IMPRESSION: Dialysis catheter as described Electronically Signed   By: Fredrich Jefferson M.D.   On: 08/14/2023 16:20   DG Abd Portable 1 View Result Date: 08/13/2023 CLINICAL DATA:  NG tube placement EXAM: PORTABLE ABDOMEN - 1 VIEW COMPARISON:  CT today FINDINGS: NG tube tip is in the stomach. Low lung volumes. Mildly dilated bowel in the upper abdomen. IMPRESSION: NG tube in the stomach. Electronically Signed   By: Janeece Mechanic M.D.   On: 08/13/2023 21:38   CT ABDOMEN PELVIS WO CONTRAST Result Date: 08/13/2023 CLINICAL DATA:  Abdominal pain EXAM: CT ABDOMEN AND PELVIS WITHOUT CONTRAST TECHNIQUE: Multidetector  CT imaging of the abdomen and pelvis was performed following the standard protocol without IV contrast. RADIATION DOSE REDUCTION: This exam was performed according to the departmental dose-optimization program which includes automated exposure control, adjustment of the mA and/or kV according to patient size and/or use of iterative reconstruction technique. COMPARISON:  02/10/2019 FINDINGS: Lower chest: Bibasilar airspace opacities favor atelectasis although early infiltrate in the lingula difficult to exclude. No effusions. Hepatobiliary: No focal hepatic abnormality. Gallbladder unremarkable. Pancreas: No focal abnormality or ductal dilatation. Spleen: No focal abnormality.  Normal size. Adrenals/Urinary Tract: No adrenal abnormality. No focal renal abnormality. No stones or hydronephrosis. Urinary bladder is unremarkable. Stomach/Bowel: Stomach and small bowel into the pelvis are dilated and fluid-filled. Distal small bowel loops are decompressed. Findings compatible with mid to distal small bowel obstruction. Scattered diverticula throughout the colon. No evidence of active diverticulitis. Vascular/Lymphatic: Aortic atherosclerosis. No evidence of aneurysm or adenopathy. Reproductive: No visible focal abnormality. Other: No free fluid or free air. Musculoskeletal: No acute bony abnormality. IMPRESSION: Dilated fluid-filled stomach and small bowel into the pelvis. Distal small bowel is decompressed. Findings compatible with mid to distal partial small bowel obstruction. Bibasilar airspace opacities, likely atelectasis although early infiltrate difficult to exclude in the lingula. Electronically Signed   By: Janeece Mechanic M.D.   On: 08/13/2023 20:45     Scheduled Meds:  Chlorhexidine  Gluconate Cloth  6 each Topical Daily   Continuous Infusions:  cefTRIAXone  (ROCEPHIN )  IV 2 g (08/14/23 0027)   dextrose  5 % and 0.45 % NaCl 100 mL/hr at 08/14/23 1636   doxycycline (VIBRAMYCIN) IV Stopped (08/14/23 1500)    lactated ringers  41 mL/hr at 08/14/23 1636     LOS: 1 day    Colin Dawley M.D on 08/14/2023 at 6:08 PM  Go to [email or web address] - for contact info  [facility] - Office  [phone]  If 7PM-7AM, please contact night-coverage [email or web address]  08/14/2023, 6:08 PM

## 2023-08-14 NOTE — Consult Note (Signed)
 Reason for Consult: AKI/CKD stage IV Referring Physician:  Quintella Buck, MD  Joseph Conrad is an 68 y.o. male with a PMH significant for DM, HTN, OSA, and CKD stage IIIb-IV who presented to [facility] ED on 08/13/23 with a 1 week history of N/V/abdominal pain, and weakness.  In the ED, Temp 97.4, Bp 92/69, HR 87, RR 22, SpO2 96%.  Labs notable for WBC 7.2, Hgb 15.1, Cl 88, Co2 24, BUN 96, Cr 12.37, gluc 66, Ca 7.8.  CT of abdomen and pelvis revealed dilated fluid filed stomach and small bowel, consistent with mid to distal partial small bowel obstruction.  He was admitted to the ICU and we were consulted due to AKI/CKD stage IIIb-IV.  The trend in Scr is seen below.  He is followed by the [place].  Of note, he had been on losartan prior to admission.   He is followed by Nephrology at the [facility] and is aware of his underlying CKD Stage IV.  Trend in Creatinine: Creatinine, Ser  Date/Time Value Ref Range Status  08/14/2023 04:42 AM 12.85 (H) 0.61 - 1.24 mg/dL Final  32/44/0102 72:53 PM 12.37 (H) 0.61 - 1.24 mg/dL Final  66/44/0347 42:59 AM 2.21    05/03/2023 04:45 PM 3.16 (H) 0.61 - 1.24 mg/dL Final  56/38/7564 33:29 AM 2.41 (H) 0.61 - 1.24 mg/dL Final  51/88/4166 06:30 AM 2.25 (H) 0.61 - 1.24 mg/dL Final  16/03/930 35:57 PM 2.18 (H) 0.61 - 1.24 mg/dL Final  32/20/2542 70:62 AM 2.28 (H) 0.61 - 1.24 mg/dL Final  37/62/8315 17:61 PM 3.18 (H) 0.61 - 1.24 mg/dL Final  60/73/7106 26:94 PM 1.87 (H) 0.61 - 1.24 mg/dL Final  85/46/2703 50:09 PM 1.33 0.4 - 1.5 mg/dL Final  38/18/2993 71:69 AM 1.3 0.4 - 1.5 mg/dL Final  67/89/3810 17:51 AM 1.2 0.4 - 1.5 mg/dL Final  02/58/5277 82:42 AM 1.5 0.4 - 1.5 mg/dL Final    PMH:   Past Medical History:  Diagnosis Date   Diabetes mellitus without complication (HCC)    IDDM   Hypertension    Shortness of breath dyspnea    with exertion   Umbilical hernia     PSH:   Past Surgical History:  Procedure Laterality Date   BIOPSY  12/02/2019   Procedure: BIOPSY;  Surgeon:  Ruby Corporal, MD;  [facility];  Service: Endoscopy;;   CERVICAL FUSION     COLONOSCOPY N/A 12/02/2019   Procedure: COLONOSCOPY;  Surgeon: Ruby Corporal, MD;  Location: [facility];  Service: Endoscopy;  Laterality: N/A;  830   ORIF PROXIMAL TIBIAL PLATEAU FRACTURE Left    UMBILICAL HERNIA REPAIR N/A 07/27/2015   Procedure: HERNIA REPAIR UMBILICAL ADULT;  Surgeon: Dareen Ebbing, MD;  Location: [facility];  Service: General;  Laterality: N/A;    Allergies: No Known Allergies  Medications:   Prior to Admission medications   Medication Sig Start Date End Date Taking? Authorizing Provider  amLODipine (NORVASC) 5 MG tablet Take 5 mg by mouth daily.   Yes [provider]  aspirin  325 MG tablet Take 325 mg by mouth in the morning and at bedtime. 12/02/19  Yes Rehman, Mathews Solomons, MD  carvedilol  (COREG ) 25 MG tablet Take 0.5 tablets (12.5 mg total) by mouth 2 (two) times daily with a meal. Patient taking differently: Take 25 mg by mouth 2 (two) times daily with a meal. 02/13/19  Yes Doroteo Gasmen, MD  Cholecalciferol (VITAMIN D) 50 MCG (2000 UT) CAPS Take 2,000  Units by mouth daily.    Yes [provider]  ferrous sulfate 325 (65 FE) MG EC tablet Take 325 mg by mouth every Monday, Wednesday, and Friday.   Yes [provider]  insulin  aspart protamine- aspart (NOVOLOG  MIX 70/30) (70-30) 100 UNIT/ML injection Inject 25 Units into the skin 2 (two) times daily with a meal.   Yes [provider]  losartan (COZAAR) 100 MG tablet Take 100 mg by mouth daily. 05/08/22  Yes [provider]  naloxone  (NARCAN ) nasal spray 4 mg/0.1 mL In the event of a narcotic overdose, spray into nostril and call 911. 05/03/23  Yes Iva Mariner, MD  Semaglutide,0.25 or 0.5MG /DOS, 2 MG/3ML SOPN Inject 0.25 mg into the skin every Monday. 07/03/22  Yes [provider]  simvastatin (ZOCOR) 20 MG tablet Take 20 mg by mouth at bedtime.   Yes [provider]    Inpatient medications:  Chlorhexidine  Gluconate Cloth  6 each Topical Daily    Discontinued Meds:   Medications Discontinued During This Encounter  Medication Reason   cyclobenzaprine (FLEXERIL) 10 MG tablet Patient Preference   docusate sodium (COLACE) 100 MG capsule Patient Preference   oxycodone  (OXY-IR) 5 MG capsule Patient Preference   dextrose  10 % infusion    lactated ringers  infusion     Social History:  reports that he has never smoked. He has never used smokeless tobacco. He reports that he does not drink alcohol and does not use drugs.  Family History:  No family history on file.  Pertinent items are noted in HPI. Weight change:   Intake/Output Summary (Last 24 hours) at 08/14/2023 1133 Last data filed at 08/14/2023 0840 Gross per 24 hour  Intake 2154.14 ml  Output 1900 ml  Net 254.14 ml   BP (!) 106/51   Pulse 95   Temp 98.6 F (37 C) (Axillary)   Resp 16   Ht 5' 8 (1.727 m)   Wt 111.1 kg   SpO2 95%   BMI 37.24 kg/m  Vitals:   08/14/23 0800 08/14/23 0900 08/14/23 1000 08/14/23 1100  BP: (!) 112/52 123/65 (!) 120/47 (!) 106/51  Pulse: 98 94 94 95  Resp: 19 19 16 16   Temp:      TempSrc:      SpO2: 94% 95% 96% 95%  Weight:      Height:         General appearance: alert, cooperative, and mild distress Head: Normocephalic, without obvious abnormality, atraumatic Eyes: negative findings: lids and lashes normal, conjunctivae and sclerae normal, and corneas clear Resp: clear to auscultation bilaterally Cardio: regular rate and rhythm, S1, S2 normal, no murmur, click, rub or gallop GI: distended, hypoactive bowel sounds, mild tenderness to palpation Extremities: extremities normal, atraumatic, no cyanosis or edema  Labs: Basic Metabolic Panel: Recent Labs  Lab 08/13/23 1858 08/14/23 0442  NA 142 138  K 4.1 3.7  CL 88* 89*  CO2 24 26  GLUCOSE 66* 144*  BUN 96* 116*  CREATININE 12.37* 12.85*  ALBUMIN 3.5  --   CALCIUM  7.8*  6.9*   Liver Function Tests: Recent Labs  Lab 08/13/23 1858  AST 18  ALT 25  ALKPHOS 61  BILITOT 0.7  PROT 7.8  ALBUMIN 3.5   Recent Labs  Lab 08/13/23 1858  LIPASE 61*   No results for input(s): AMMONIA in the last 168 hours. CBC: Recent Labs  Lab 08/13/23 1858 08/14/23 0442  WBC 7.2 7.7  NEUTROABS 4.7  --  HGB 15.1 13.6  HCT 45.9 41.9  MCV 85.8 83.8  PLT 255 222   PT/INR: @LABRCNTIP (inr:5) Cardiac Enzymes: )No results for input(s): CKTOTAL, CKMB, CKMBINDEX, TROPONINI in the last 168 hours. CBG: Recent Labs  Lab 08/13/23 2320 08/14/23 0121 08/14/23 0416 08/14/23 0613 08/14/23 0743  GLUCAP 99 113* 144* 118* 88    Iron Studies: No results for input(s): IRON, TIBC, TRANSFERRIN, FERRITIN in the last 168 hours.  Xrays/Other Studies: DG Abd Portable 1 View Result Date: 08/13/2023 CLINICAL DATA:  NG tube placement EXAM: PORTABLE ABDOMEN - 1 VIEW COMPARISON:  CT today FINDINGS: NG tube tip is in the stomach. Low lung volumes. Mildly dilated bowel in the upper abdomen. IMPRESSION: NG tube in the stomach. Electronically Signed   By: Janeece Mechanic M.D.   On: 08/13/2023 21:38   CT ABDOMEN PELVIS WO CONTRAST Result Date: 08/13/2023 CLINICAL DATA:  Abdominal pain EXAM: CT ABDOMEN AND PELVIS WITHOUT CONTRAST TECHNIQUE: Multidetector CT imaging of the abdomen and pelvis was performed following the standard protocol without IV contrast. RADIATION DOSE REDUCTION: This exam was performed according to the departmental dose-optimization program which includes automated exposure control, adjustment of the mA and/or kV according to patient size and/or use of iterative reconstruction technique. COMPARISON:  02/10/2019 FINDINGS: Lower chest: Bibasilar airspace opacities favor atelectasis although early infiltrate in the lingula difficult to exclude. No effusions. Hepatobiliary: No focal hepatic abnormality. Gallbladder unremarkable. Pancreas: No focal abnormality or  ductal dilatation. Spleen: No focal abnormality.  Normal size. Adrenals/Urinary Tract: No adrenal abnormality. No focal renal abnormality. No stones or hydronephrosis. Urinary bladder is unremarkable. Stomach/Bowel: Stomach and small bowel into the pelvis are dilated and fluid-filled. Distal small bowel loops are decompressed. Findings compatible with mid to distal small bowel obstruction. Scattered diverticula throughout the colon. No evidence of active diverticulitis. Vascular/Lymphatic: Aortic atherosclerosis. No evidence of aneurysm or adenopathy. Reproductive: No visible focal abnormality. Other: No free fluid or free air. Musculoskeletal: No acute bony abnormality. IMPRESSION: Dilated fluid-filled stomach and small bowel into the pelvis. Distal small bowel is decompressed. Findings compatible with mid to distal partial small bowel obstruction. Bibasilar airspace opacities, likely atelectasis although early infiltrate difficult to exclude in the lingula. Electronically Signed   By: Janeece Mechanic M.D.   On: 08/13/2023 20:45     Assessment/Plan:  AKI/CKD stage IV - likely ischemic ATN in seeing of volume depletion and hypotension from N/V/SBO in setting of ARB therapy.  Significant climb in BUN/Cr.  No urine output at this time.  Agree with ongoing IVF resuscitation.  I did discuss the possible need for dialysis with Joseph Conrad and his fiance who was at the bedside.  He is amenable to have temp HD catheter placed today.  No urgent indication for dialysis at this time, however he will likely require it in the next 24 hours if no significant improvement occurs.    Avoid nephrotoxic medications including NSAIDs and iodinated intravenous contrast exposure unless the latter is absolutely indicated.   Preferred narcotic agents for pain control are hydromorphone , fentanyl , and methadone. Morphine  should not be used.  Avoid Baclofen and avoid oral sodium phosphate  and magnesium citrate based laxatives / bowel  preps.  Continue strict Input and Output monitoring. Will monitor the patient closely with you and intervene or adjust therapy as indicated by changes in clinical status/labs   SBO - NGT in place.  Surgery consulted.  Plan per primary DM with hypoglycemia - started on D5 1/2 normal saline drip. HTN - blood pressure  is low likely due to severe volume depletion.  Continue with IVF's and continue to hold ARB. Aspiration pneumonia - on abx per primary.   Fletcher Humble Lyne Khurana 08/14/2023, 11:33 AM    The patient is critically ill with AKI and hypotension due to SBO and volume depletion and which includes my role to primarily manage AKI/CKD stage IV.  This requires high complexity decision making.  Total critical care time: 30 minutes    Critical care time was exclusive of treating other patients.   Critical care was necessary to treat or prevent imminent or life-threatening deterioration.   Critical care was time spent personally by me on the following activities:   development of treatment plan with patient and/or surrogate as well as nursing,   discussions with other provider evaluation of patient's response to treatment  examination of patient  obtaining history from patient or surrogate  ordering and performing treatments and interventions  ordering and review of laboratory studies  ordering and review of radiographic studies

## 2023-08-15 DIAGNOSIS — K56609 Unspecified intestinal obstruction, unspecified as to partial versus complete obstruction: Secondary | ICD-10-CM | POA: Diagnosis not present

## 2023-08-15 LAB — RENAL FUNCTION PANEL
Albumin: 2.5 g/dL — ABNORMAL LOW (ref 3.5–5.0)
Anion gap: 23 — ABNORMAL HIGH (ref 5–15)
BUN: 133 mg/dL — ABNORMAL HIGH (ref 8–23)
CO2: 26 mmol/L (ref 22–32)
Calcium: 6.4 mg/dL — CL (ref 8.9–10.3)
Chloride: 90 mmol/L — ABNORMAL LOW (ref 98–111)
Creatinine, Ser: 13.4 mg/dL — ABNORMAL HIGH (ref 0.61–1.24)
GFR, Estimated: 4 mL/min — ABNORMAL LOW (ref >60)
Glucose, Bld: 152 mg/dL — ABNORMAL HIGH (ref 70–99)
Phosphorus: 7.3 mg/dL — ABNORMAL HIGH (ref 2.5–4.6)
Potassium: 3.5 mmol/L (ref 3.5–5.1)
Sodium: 139 mmol/L (ref 135–145)

## 2023-08-15 LAB — GLUCOSE, CAPILLARY
Glucose-Capillary: 179 mg/dL — ABNORMAL HIGH (ref 70–99)
Glucose-Capillary: 171 mg/dL — ABNORMAL HIGH (ref 70–99)

## 2023-08-15 LAB — HEPATITIS B SURFACE ANTIGEN: Hepatitis B Surface Ag: NONREACTIVE

## 2023-08-15 MED ORDER — HEPARIN SODIUM (PORCINE) 1000 UNIT/ML DIALYSIS
1000.0000 [IU] | INTRAMUSCULAR | Status: DC | PRN
Start: 2023-08-15 — End: 2023-08-16

## 2023-08-15 MED ORDER — HEPARIN SODIUM (PORCINE) 1000 UNIT/ML IJ SOLN
INTRAMUSCULAR | Status: AC
  Filled 2023-08-15: qty 4

## 2023-08-15 MED ORDER — DEXTROSE-SODIUM CHLORIDE 5-0.45 % IV SOLN: INTRAVENOUS | Status: AC

## 2023-08-15 MED ORDER — CALCIUM GLUCONATE-NACL 1-0.675 GM/50ML-% IV SOLN
1.0000 g | Freq: Once | INTRAVENOUS | Status: AC
  Administered 2023-08-15: 1000 mg via INTRAVENOUS
  Filled 2023-08-15: qty 50

## 2023-08-15 MED ORDER — NEPRO/CARBSTEADY PO LIQD: 237.0000 mL | ORAL | Status: DC | PRN

## 2023-08-15 MED ORDER — IPRATROPIUM-ALBUTEROL 0.5-2.5 (3) MG/3ML IN SOLN
3.0000 mL | Freq: Three times a day (TID) | RESPIRATORY_TRACT | Status: DC
  Administered 2023-08-15 (×2): 3 mL via RESPIRATORY_TRACT
  Filled 2023-08-15 (×2): qty 3

## 2023-08-15 MED ORDER — CHLORHEXIDINE GLUCONATE CLOTH 2 % EX PADS
6.0000 | MEDICATED_PAD | Freq: Every day | CUTANEOUS | Status: DC
Start: 2023-08-15 — End: 2023-08-22
  Administered 2023-08-15 – 2023-08-22 (×7): 6 via TOPICAL

## 2023-08-15 MED ORDER — ALTEPLASE 2 MG IJ SOLR: 2.0000 mg | Freq: Once | INTRAMUSCULAR | Status: DC | PRN

## 2023-08-15 MED ORDER — CALCIUM CARBONATE 1250 (500 CA) MG PO TABS: 1.0000 | ORAL_TABLET | Freq: Every day | ORAL | Status: DC

## 2023-08-15 NOTE — Progress Notes (Signed)
  INITIAL HEMODIALYSIS TREATMENT NOTE:  2.5 hour heparin -free treatment completed using RIJ vascath.  Venous port would not aspirate, but flushed without resistance.  Prescribed Qb of 200 was maintained with stable pressures.  Circuit did clot during the last hour (despite q6m NS flushes), cartridge was changed, and the remainder of the treatment was completed uneventfully.  NO UF as per order.  Hemodynamically stable.  All blood was returned.  Please consider low-dose heparin , if possible.  Post-HD:  08/15/23 2230  Vital Signs  Temp 98 F (36.7 C)  Temp Source Oral  Pulse Rate (!) 107  Pulse Rate Source Monitor  Resp 17  BP 127/81  BP Location Left Arm  BP Method Automatic  Patient Position (if appropriate) Lying  Oxygen Therapy  SpO2 96 %  O2 Device Room Air  Post Treatment  Dialyzer Clearance Lightly streaked  Hemodialysis Intake (mL) 0 mL  Liters Processed 30.1  Fluid Removed (mL) 0 mL  Tolerated HD Treatment Yes  Post-Hemodialysis Comments Kept even, as per order.    Waunita Haff, RN [facility] ICU5

## 2023-08-15 NOTE — Progress Notes (Signed)
 Patient ID: Joseph Conrad, male   DOB: [birth year], 68 y.o.   MRN: [id number] S: Started to have some UOP but BUN/Cr continue to rise. O:BP 126/62   Pulse 96   Temp 98.1 F (36.7 C) (Axillary)   Resp 17   Ht 5' 8 (1.727 m)   Wt 111.1 kg   SpO2 95%   BMI 37.24 kg/m   Intake/Output Summary (Last 24 hours) at 08/15/2023 1037 Last data filed at 08/15/2023 0500 Gross per 24 hour  Intake 3254.06 ml  Output 2475 ml  Net 779.06 ml   Intake/Output: I/O last 3 completed shifts: In: 5408.2 [I.V.:3491.9; IV Piggyback:1916.3] Out: 4375 [Urine:325; Emesis/NG output:4050]  Intake/Output this shift:  No intake/output data recorded. Weight change:  Gen: NAD CVS: RRR Resp:CTA Abd: distended, hypoactive bowel sounds, nontender Ext: no edema  Recent Labs  Lab 08/13/23 1858 08/14/23 0442 08/15/23 0419  NA 142 138 139  K 4.1 3.7 3.5  CL 88* 89* 90*  CO2 24 26 26   GLUCOSE 66* 144* 152*  BUN 96* 116* 133*  CREATININE 12.37* 12.85* 13.40*  ALBUMIN 3.5  --  2.5*  CALCIUM  7.8* 6.9* 6.4*  PHOS  --   --  7.3*  AST 18  --   --   ALT 25  --   --    Liver Function Tests: Recent Labs  Lab 08/13/23 1858 08/15/23 0419  AST 18  --   ALT 25  --   ALKPHOS 61  --   BILITOT 0.7  --   PROT 7.8  --   ALBUMIN 3.5 2.5*   Recent Labs  Lab 08/13/23 1858  LIPASE 61*   No results for input(s): AMMONIA in the last 168 hours. CBC: Recent Labs  Lab 08/13/23 1858 08/14/23 0442  WBC 7.2 7.7  NEUTROABS 4.7  --   HGB 15.1 13.6  HCT 45.9 41.9  MCV 85.8 83.8  PLT 255 222   Cardiac Enzymes: No results for input(s): CKTOTAL, CKMB, CKMBINDEX, TROPONINI in the last 168 hours. CBG: Recent Labs  Lab 08/14/23 0743 08/14/23 1158 08/14/23 1605 08/14/23 2354 08/15/23 0743  GLUCAP 88 121* 158* 132* 179*    Iron Studies: No results for input(s): IRON, TIBC, TRANSFERRIN, FERRITIN in the last 72 hours. Studies/Results: DG Chest Port 1 View Result Date:  08/14/2023 CLINICAL DATA:  Dialysis catheter placement EXAM: PORTABLE CHEST 1 VIEW COMPARISON:  January nineteen two FINDINGS: Right IJ dialysis catheter tip in the cavoatrial junction. No pneumothorax. Heart and mediastinum normal without significant infiltrates. Minimal left lower lobe hypoventilatory atelectatic changes Nasogastric tube in the body of the stomach. IMPRESSION: Dialysis catheter as described Electronically Signed   By: Fredrich Jefferson M.D.   On: 08/14/2023 16:20   DG Abd Portable 1 View Result Date: 08/13/2023 CLINICAL DATA:  NG tube placement EXAM: PORTABLE ABDOMEN - 1 VIEW COMPARISON:  CT today FINDINGS: NG tube tip is in the stomach. Low lung volumes. Mildly dilated bowel in the upper abdomen. IMPRESSION: NG tube in the stomach. Electronically Signed   By: Janeece Mechanic M.D.   On: 08/13/2023 21:38   CT ABDOMEN PELVIS WO CONTRAST Result Date: 08/13/2023 CLINICAL DATA:  Abdominal pain EXAM: CT ABDOMEN AND PELVIS WITHOUT CONTRAST TECHNIQUE: Multidetector CT imaging of the abdomen and pelvis was performed following the standard protocol without IV contrast. RADIATION DOSE REDUCTION: This exam was performed according to the departmental dose-optimization program which includes automated exposure control, adjustment of the mA and/or kV according to patient size  and/or use of iterative reconstruction technique. COMPARISON:  02/10/2019 FINDINGS: Lower chest: Bibasilar airspace opacities favor atelectasis although early infiltrate in the lingula difficult to exclude. No effusions. Hepatobiliary: No focal hepatic abnormality. Gallbladder unremarkable. Pancreas: No focal abnormality or ductal dilatation. Spleen: No focal abnormality.  Normal size. Adrenals/Urinary Tract: No adrenal abnormality. No focal renal abnormality. No stones or hydronephrosis. Urinary bladder is unremarkable. Stomach/Bowel: Stomach and small bowel into the pelvis are dilated and fluid-filled. Distal small bowel loops are  decompressed. Findings compatible with mid to distal small bowel obstruction. Scattered diverticula throughout the colon. No evidence of active diverticulitis. Vascular/Lymphatic: Aortic atherosclerosis. No evidence of aneurysm or adenopathy. Reproductive: No visible focal abnormality. Other: No free fluid or free air. Musculoskeletal: No acute bony abnormality. IMPRESSION: Dilated fluid-filled stomach and small bowel into the pelvis. Distal small bowel is decompressed. Findings compatible with mid to distal partial small bowel obstruction. Bibasilar airspace opacities, likely atelectasis although early infiltrate difficult to exclude in the lingula. Electronically Signed   By: Janeece Mechanic M.D.   On: 08/13/2023 20:45    Chlorhexidine  Gluconate Cloth  6 each Topical Daily   ipratropium-albuterol   3 mL Nebulization TID    BMET    Component Value Date/Time   NA 139 08/15/2023 0419   K 3.5 08/15/2023 0419   CL 90 (L) 08/15/2023 0419   CO2 26 08/15/2023 0419   GLUCOSE 152 (H) 08/15/2023 0419   GLUCOSE 124 (H) 12/30/2005 0906   BUN 133 (H) 08/15/2023 0419   CREATININE 13.40 (H) 08/15/2023 0419   CALCIUM  6.4 (LL) 08/15/2023 0419   GFRNONAA 4 (L) 08/15/2023 0419   GFRAA 32 (L) 02/12/2019 0533   CBC    Component Value Date/Time   WBC 7.7 08/14/2023 0442   RBC 5.00 08/14/2023 0442   HGB 13.6 08/14/2023 0442   HCT 41.9 08/14/2023 0442   PLT 222 08/14/2023 0442   MCV 83.8 08/14/2023 0442   MCH 27.2 08/14/2023 0442   MCHC 32.5 08/14/2023 0442   RDW 14.6 08/14/2023 0442   LYMPHSABS 1.3 08/13/2023 1858   MONOABS 1.1 (H) 08/13/2023 1858   EOSABS 0.0 08/13/2023 1858   BASOSABS 0.0 08/13/2023 1858    Assessment/Plan:  AKI/CKD stage IV, oliguric - likely ischemic ATN in setting of volume depletion and hypotension from N/V/SBO and concomitant ARB therapy.  Significant climb in BUN/Cr continues.  Has had some UOP over the past 24 hours but only 325.  Continue with ongoing IVF resuscitation.  I  again discussed his trend in BUN/Cr and he is amenable to proceed with slow, short, initiation of HD.  Will plan for HD today and again tomorrow and follow UOP and labs.  Will keep even.  Temp HD cath placed by Dr. Collene Dawson on 08/14/23.    Avoid nephrotoxic medications including NSAIDs and iodinated intravenous contrast exposure unless the latter is absolutely indicated.   Preferred narcotic agents for pain control are hydromorphone , fentanyl , and methadone. Morphine  should not be used.  Avoid Baclofen and avoid oral sodium phosphate  and magnesium citrate based laxatives / bowel preps.  Continue strict Input and Output monitoring. Will monitor the patient closely with you and intervene or adjust therapy as indicated by changes in clinical status/labs   SBO - NGT in place.  Surgery consulted.  Plan per primary DM with hypoglycemia - started on D5 1/2 normal saline drip. HTN - blood pressure is low likely due to severe volume depletion.  Continue with IVF's and continue to hold ARB. Aspiration  pneumonia - on abx per primary.  Benjamin Brands, MD [facility]   This patient will not be physically seen over the weekend but his chart will be reviewed remotely and on-call coverage will be available if needed.

## 2023-08-15 NOTE — Plan of Care (Signed)

## 2023-08-15 NOTE — Progress Notes (Addendum)
 PROGRESS NOTE  Joseph Conrad, is a 68 y.o. male, DOB - 05-31-1955, ZOX:[id number]  Admit date - 08/13/2023   Admitting Physician Joseph Debroah Fanning, MD  Outpatient Primary MD for the patient is [facility]  LOS - 2  CC---no urine     Brief Narrative:  68 y.o. male with medical history significant for CKD IV, diabetes mellitus, hypertension, OSA admitted on 08/13/2023 with AKI on CKD in the setting of SBO   -Assessment and Plan: 1)SBO (small bowel obstruction) --POA -- Recommends continue NG tube and conservative approach for now - Patient denies prior surgeries.  Colonoscopy 2021 - diverticulosis, 1 polyp and internal hemorrhoids. -Gen Surgery consult from Dr. Collene Conrad appreciated--- - Continue IV fluids while awaiting return of bowel function and tolerance of oral intake  2)Persistent Hypoglycemia--- due to #1 above -Hold PTA insulin  - Blood glucose was down to the 50s---improving with iv Dextrose  - Continuous IV dextrose  drip for now  3)AKI----acute kidney injury on CKD stage - IV--in the setting of dehydration with volume depletion and hypotension due to #1, compounded by losartan use PTA -  creatinine on admission= 12.37 - baseline creatinine =  3.1 (05/03/23)  ,  creatinine is now= 13.40 ([place]) , -Continue IV fluids -Hold PTA losartan - renally adjust medications, avoid nephrotoxic agents / dehydration  / hypotension - Status post right IJ temporary catheter placement on 08/14/2023 for possible HD treatments - Nephrology input appreciated -Plan is to initiate HD on 08/15/23  4)Aspiration Pneumonia/CAP---POA - Suspect due to #1 above with persistent emesis - Continue Rocephin /Doxy - prn bronchodilators - Supplemental oxygen as needed  5)HTN--was hypotensive due to volume depletion in setting of # 1 above - continue to Hold Carvedilol , Losartan and Norvasc 5 mg  6)Hypocalcemia--when corrected for low albumin he has mild hypocalcemia --Replace and  recheck  7)Hyperphostamia ---in the setting of # 1 above  Status is: Inpatient   Disposition: The patient is from: Home              Anticipated d/c is to: Home              Anticipated d/c date is: > 3 days              Patient currently is not medically stable to d/c. Barriers: Not Clinically Stable-   Code Status :  -  Code Status: Full Code   Family Communication:    (patient is alert, awake and coherent)  Discussed with S/o Joseph Conrad at bedside  DVT Prophylaxis  :   - SCDs   SCDs Start: 08/13/23 2359  Lab Results  Component Value Date   PLT 222 08/14/2023   Inpatient Medications  Scheduled Meds:  Chlorhexidine  Gluconate Cloth  6 each Topical Daily   Continuous Infusions:  cefTRIAXone  (ROCEPHIN )  IV Stopped (08/15/23 0035)   doxycycline  (VIBRAMYCIN ) IV Stopped (08/15/23 0338)   PRN Meds:.acetaminophen  **OR** acetaminophen , albuterol , HYDROmorphone  (DILAUDID ) injection, promethazine    Anti-infectives (From admission, onward)    Start     Dose/Rate Route Frequency Ordered Stop   08/14/23 0100  cefTRIAXone  (ROCEPHIN ) 2 g in sodium chloride  0.9 % 100 mL IVPB        2 g 200 mL/hr over 30 Minutes Intravenous Every 24 hours 08/14/23 0000     08/14/23 0100  doxycycline  (VIBRAMYCIN ) 100 mg in sodium chloride  0.9 % 250 mL IVPB        100 mg 125 mL/hr over 120 Minutes Intravenous Every 12 hours 08/14/23 0000  Subjective: Joseph Conrad today has no fevers, No chest pain,    - No further emesis with NG in situ -Minimal urine output---Agreeable to initiate HD  Objective: Vitals:   08/15/23 0700 08/15/23 0800 08/15/23 0805 08/15/23 0900  BP: 115/67 (!) 120/58  126/62  Pulse: 98 90  96  Resp: 15 17  17   Temp:   98.1 F (36.7 C)   TempSrc:   Axillary   SpO2: 97% 97%  95%  Weight:      Height:        Intake/Output Summary (Last 24 hours) at 08/15/2023 0946 Last data filed at 08/15/2023 0500 Gross per 24 hour  Intake 3254.06 ml  Output 2475 ml  Net 779.06 ml    Filed Weights   08/13/23 2000 08/13/23 2300  Weight: 119.7 kg 111.1 kg   Physical Exam Gen:- Awake Alert,  in no apparent distress  HEENT:- [place].AT, No sclera icterus Nose-NG tube with gastric contents Neck-Supple Neck, right IJ HD catheter Lungs-  CTAB , fair symmetrical air movement CV- S1, S2 normal, regular  Abd-  +ve B.Sounds, Abd Soft, improving abdominal distention Extremity/Skin:- No  edema, pedal pulses present  Psych-affect is appropriate, oriented x3 Neuro-no new focal deficits, no tremors  Data Reviewed: I have personally reviewed following labs and imaging studies  CBC: Recent Labs  Lab 08/13/23 1858 08/14/23 0442  WBC 7.2 7.7  NEUTROABS 4.7  --   HGB 15.1 13.6  HCT 45.9 41.9  MCV 85.8 83.8  PLT 255 222   Basic Metabolic Panel: Recent Labs  Lab 08/13/23 1858 08/14/23 0442 08/15/23 0419  NA 142 138 139  K 4.1 3.7 3.5  CL 88* 89* 90*  CO2 24 26 26   GLUCOSE 66* 144* 152*  BUN 96* 116* 133*  CREATININE 12.37* 12.85* 13.40*  CALCIUM  7.8* 6.9* 6.4*  MG  --  2.5*  --   PHOS  --   --  7.3*   GFR: Estimated Creatinine Clearance: 6.6 mL/min (A) (by C-G formula based on SCr of 13.4 mg/dL (H)). Liver Function Tests: Recent Labs  Lab 08/13/23 1858 08/15/23 0419  AST 18  --   ALT 25  --   ALKPHOS 61  --   BILITOT 0.7  --   PROT 7.8  --   ALBUMIN 3.5 2.5*   HbA1C: Recent Labs    08/14/23 0442  HGBA1C 5.5    Recent Results (from the past 240 hours)  Blood culture (routine x 2)     Status: None (Preliminary result)   Collection Time: 08/13/23  6:58 PM   Specimen: BLOOD  Result Value Ref Range Status   Specimen Description BLOOD RIGHT ANTECUBITAL  Final   Special Requests   Final    BOTTLES DRAWN AEROBIC AND ANAEROBIC Blood Culture adequate volume   Culture   Final    NO GROWTH 2 DAYS Performed at [facility], [address] [place], [place] [address]    Report Status PENDING  Incomplete  Blood culture (routine x 2)     Status: None  (Preliminary result)   Collection Time: 08/13/23  6:58 PM   Specimen: BLOOD  Result Value Ref Range Status   Specimen Description BLOOD LEFT ANTECUBITAL  Final   Special Requests   Final    BOTTLES DRAWN AEROBIC ONLY Blood Culture results may not be optimal due to an inadequate volume of blood received in culture bottles   Culture   Final    NO GROWTH 2 DAYS  Performed at [facility], [address] [place], [place] [address]    Report Status PENDING  Incomplete  MRSA Next Gen by PCR, Nasal     Status: None   Collection Time: 08/13/23 11:36 PM   Specimen: Nasal Mucosa; Nasal Swab  Result Value Ref Range Status   MRSA by PCR Next Gen NOT DETECTED NOT DETECTED Final    Comment: (NOTE) The GeneXpert MRSA Assay (FDA approved for NASAL specimens only), is one component of a comprehensive MRSA colonization surveillance program. It is not intended to diagnose MRSA infection nor to guide or monitor treatment for MRSA infections. Test performance is not FDA approved in patients less than 45 years old. Performed at [facility], [address], [place], [place] [address]     Radiology Studies: [facility] Chest [facility] 1 View Result Date: 08/14/2023 CLINICAL DATA:  Dialysis catheter placement EXAM: PORTABLE CHEST 1 VIEW COMPARISON:  January nineteen two FINDINGS: Right IJ dialysis catheter tip in the cavoatrial junction. No pneumothorax. Heart and mediastinum normal without significant infiltrates. Minimal left lower lobe hypoventilatory atelectatic changes Nasogastric tube in the body of the stomach. IMPRESSION: Dialysis catheter as described Electronically Signed   By: Fredrich Jefferson M.D.   On: 08/14/2023 16:20   DG Abd Portable 1 View Result Date: 08/13/2023 CLINICAL DATA:  NG tube placement EXAM: PORTABLE ABDOMEN - 1 VIEW COMPARISON:  CT today FINDINGS: NG tube tip is in the stomach. Low lung volumes. Mildly dilated bowel in the upper abdomen. IMPRESSION: NG tube in the stomach. Electronically Signed    By: Janeece Mechanic M.D.   On: 08/13/2023 21:38   CT ABDOMEN PELVIS WO CONTRAST Result Date: 08/13/2023 CLINICAL DATA:  Abdominal pain EXAM: CT ABDOMEN AND PELVIS WITHOUT CONTRAST TECHNIQUE: Multidetector CT imaging of the abdomen and pelvis was performed following the standard protocol without IV contrast. RADIATION DOSE REDUCTION: This exam was performed according to the departmental dose-optimization program which includes automated exposure control, adjustment of the mA and/or kV according to patient size and/or use of iterative reconstruction technique. COMPARISON:  02/10/2019 FINDINGS: Lower chest: Bibasilar airspace opacities favor atelectasis although early infiltrate in the lingula difficult to exclude. No effusions. Hepatobiliary: No focal hepatic abnormality. Gallbladder unremarkable. Pancreas: No focal abnormality or ductal dilatation. Spleen: No focal abnormality.  Normal size. Adrenals/Urinary Tract: No adrenal abnormality. No focal renal abnormality. No stones or hydronephrosis. Urinary bladder is unremarkable. Stomach/Bowel: Stomach and small bowel into the pelvis are dilated and fluid-filled. Distal small bowel loops are decompressed. Findings compatible with mid to distal small bowel obstruction. Scattered diverticula throughout the colon. No evidence of active diverticulitis. Vascular/Lymphatic: Aortic atherosclerosis. No evidence of aneurysm or adenopathy. Reproductive: No visible focal abnormality. Other: No free fluid or free air. Musculoskeletal: No acute bony abnormality. IMPRESSION: Dilated fluid-filled stomach and small bowel into the pelvis. Distal small bowel is decompressed. Findings compatible with mid to distal partial small bowel obstruction. Bibasilar airspace opacities, likely atelectasis although early infiltrate difficult to exclude in the lingula. Electronically Signed   By: Janeece Mechanic M.D.   On: 08/13/2023 20:45   Scheduled Meds:  Chlorhexidine  Gluconate Cloth  6 each  Topical Daily   Continuous Infusions:  cefTRIAXone  (ROCEPHIN )  IV Stopped (08/15/23 0035)   doxycycline  (VIBRAMYCIN ) IV Stopped (08/15/23 0865)    LOS: 2 days   Colin Dawley M.D on 08/15/2023 at 9:46 AM  Go to [email or web address] - for contact info  [facility] - Office  [phone]  If 7PM-7AM, please contact night-coverage [email or web address]  08/15/2023, 9:46 AM

## 2023-08-15 NOTE — Progress Notes (Signed)
 [facility] Progress Note     Subjective: Pain improved. No Bm or flatus.   Objective: Vital signs in last 24 hours: Temp:  [98.1 F (36.7 C)-100 F (37.8 C)] 98.1 F (36.7 C) (06/13 0805) Pulse Rate:  [90-103] 96 (06/13 1200) Resp:  [13-21] 13 (06/13 1200) BP: (106-148)/(53-73) 127/67 (06/13 1200) SpO2:  [91 %-97 %] 97 % (06/13 1200) Last BM Date : 08/09/23  Intake/Output from previous day: 06/12 0701 - 06/13 0700 In: 4138.2 [I.V.:3222; IV Piggyback:916.3] Out: 2475 [Urine:325; Emesis/NG output:2150] Intake/Output this shift: No intake/output data recorded.  General appearance: alert and no distress GI: soft, rotund, no pain  Lab Results:  Recent Labs    08/13/23 1858 08/14/23 0442  WBC 7.2 7.7  HGB 15.1 13.6  HCT 45.9 41.9  PLT 255 222   BMET Recent Labs    08/14/23 0442 08/15/23 0419  NA 138 139  K 3.7 3.5  CL 89* 90*  CO2 26 26  GLUCOSE 144* 152*  BUN 116* 133*  CREATININE 12.85* 13.40*  CALCIUM  6.9* 6.4*   PT/INR No results for input(s): LABPROT, INR in the last 72 hours.  Studies/Results: DG Chest Port 1 View Result Date: 08/14/2023 CLINICAL DATA:  Dialysis catheter placement EXAM: PORTABLE CHEST 1 VIEW COMPARISON:  January nineteen two FINDINGS: Right IJ dialysis catheter tip in the cavoatrial junction. No pneumothorax. Heart and mediastinum normal without significant infiltrates. Minimal left lower lobe hypoventilatory atelectatic changes Nasogastric tube in the body of the stomach. IMPRESSION: Dialysis catheter as described Electronically Signed   By: Fredrich Jefferson M.D.   On: 08/14/2023 16:20   DG Abd Portable 1 View Result Date: 08/13/2023 CLINICAL DATA:  NG tube placement EXAM: PORTABLE ABDOMEN - 1 VIEW COMPARISON:  CT today FINDINGS: NG tube tip is in the stomach. Low lung volumes. Mildly dilated bowel in the upper abdomen. IMPRESSION: NG tube in the stomach. Electronically Signed   By: Janeece Mechanic M.D.   On: 08/13/2023  21:38   CT ABDOMEN PELVIS WO CONTRAST Result Date: 08/13/2023 CLINICAL DATA:  Abdominal pain EXAM: CT ABDOMEN AND PELVIS WITHOUT CONTRAST TECHNIQUE: Multidetector CT imaging of the abdomen and pelvis was performed following the standard protocol without IV contrast. RADIATION DOSE REDUCTION: This exam was performed according to the departmental dose-optimization program which includes automated exposure control, adjustment of the mA and/or kV according to patient size and/or use of iterative reconstruction technique. COMPARISON:  02/10/2019 FINDINGS: Lower chest: Bibasilar airspace opacities favor atelectasis although early infiltrate in the lingula difficult to exclude. No effusions. Hepatobiliary: No focal hepatic abnormality. Gallbladder unremarkable. Pancreas: No focal abnormality or ductal dilatation. Spleen: No focal abnormality.  Normal size. Adrenals/Urinary Tract: No adrenal abnormality. No focal renal abnormality. No stones or hydronephrosis. Urinary bladder is unremarkable. Stomach/Bowel: Stomach and small bowel into the pelvis are dilated and fluid-filled. Distal small bowel loops are decompressed. Findings compatible with mid to distal small bowel obstruction. Scattered diverticula throughout the colon. No evidence of active diverticulitis. Vascular/Lymphatic: Aortic atherosclerosis. No evidence of aneurysm or adenopathy. Reproductive: No visible focal abnormality. Other: No free fluid or free air. Musculoskeletal: No acute bony abnormality. IMPRESSION: Dilated fluid-filled stomach and small bowel into the pelvis. Distal small bowel is decompressed. Findings compatible with mid to distal partial small bowel obstruction. Bibasilar airspace opacities, likely atelectasis although early infiltrate difficult to exclude in the lingula. Electronically Signed   By: Janeece Mechanic M.D.   On: 08/13/2023 20:45    Anti-infectives: Anti-infectives (From admission, onward)  Start     Dose/Rate Route  Frequency Ordered Stop   08/14/23 0100  cefTRIAXone  (ROCEPHIN ) 2 g in sodium chloride  0.9 % 100 mL IVPB        2 g 200 mL/hr over 30 Minutes Intravenous Every 24 hours 08/14/23 0000     08/14/23 0100  doxycycline  (VIBRAMYCIN ) 100 mg in sodium chloride  0.9 % 250 mL IVPB        100 mg 125 mL/hr over 120 Minutes Intravenous Every 12 hours 08/14/23 0000         Assessment/Plan: Patient with SBO and AKI. Doing fair. Awaiting bowel function. NPO, NG in place Dialysis planned today   LOS: 2 days    Joseph Conrad 08/15/2023

## 2023-08-16 ENCOUNTER — Inpatient Hospital Stay (HOSPITAL_COMMUNITY)

## 2023-08-16 DIAGNOSIS — K56609 Unspecified intestinal obstruction, unspecified as to partial versus complete obstruction: Secondary | ICD-10-CM | POA: Diagnosis not present

## 2023-08-16 LAB — GLUCOSE, CAPILLARY: Glucose-Capillary: 136 mg/dL — ABNORMAL HIGH (ref 70–99)

## 2023-08-16 LAB — RENAL FUNCTION PANEL
Albumin: 2.6 g/dL — ABNORMAL LOW (ref 3.5–5.0)
Anion gap: 17 — ABNORMAL HIGH (ref 5–15)
BUN: 93 mg/dL — ABNORMAL HIGH (ref 8–23)
CO2: 26 mmol/L (ref 22–32)
Calcium: 7.1 mg/dL — ABNORMAL LOW (ref 8.9–10.3)
Chloride: 93 mmol/L — ABNORMAL LOW (ref 98–111)
Creatinine, Ser: 9.84 mg/dL — ABNORMAL HIGH (ref 0.61–1.24)
GFR, Estimated: 5 mL/min — ABNORMAL LOW (ref >60)
Glucose, Bld: 142 mg/dL — ABNORMAL HIGH (ref 70–99)
Phosphorus: 5.7 mg/dL — ABNORMAL HIGH (ref 2.5–4.6)
Potassium: 3.4 mmol/L — ABNORMAL LOW (ref 3.5–5.1)
Sodium: 136 mmol/L (ref 135–145)

## 2023-08-16 LAB — HEPATITIS B SURFACE ANTIBODY, QUANTITATIVE: Hep B S AB Quant (Post): <3.5 m[IU]/mL — ABNORMAL LOW

## 2023-08-16 MED ORDER — POTASSIUM CHLORIDE CRYS ER 20 MEQ PO TBCR: 40.0000 meq | EXTENDED_RELEASE_TABLET | ORAL | Status: DC

## 2023-08-16 MED ORDER — POTASSIUM CHLORIDE 20 MEQ PO PACK
40.0000 meq | PACK | ORAL | Status: AC
  Administered 2023-08-16 (×2): 40 meq
  Filled 2023-08-16 (×2): qty 2

## 2023-08-16 NOTE — Progress Notes (Signed)
 [facility]  Feeling better. Abdomen less rotund. RN reported bowel sounds. Making some urine after HD.   BP (!) 145/106   Pulse (!) 102   Temp 99.7 F (37.6 C) (Oral)   Resp 18   Ht 5' 8 (1.727 m)   Wt 111.3 kg   SpO2 91%   BMI 37.31 kg/m  NG output green but 800cc down from over 1000 Soft, nontender  Patient with SBO and AKI. Doing better. NPO, NG Awaiting bowel function Up to chair at least today  Mobilize as much as possible  Updated team.   Deena Farrier, MD [facility] [address] [place], [place] [address] [phone] (office)

## 2023-08-16 NOTE — Plan of Care (Signed)

## 2023-08-16 NOTE — Progress Notes (Signed)
 PROGRESS NOTE  Joseph Conrad, is a 68 y.o. male, DOB - Sep 04, 1955, QIH:[id number]  Admit date - 08/13/2023   Admitting Physician Ejiroghene Debroah Fanning, MD  Outpatient Primary MD for the patient is [facility], [facility]  LOS - 3  CC---no urine     Brief Narrative:  68 y.o. male with medical history significant for CKD IV, diabetes mellitus, hypertension, OSA admitted on 08/13/2023 with AKI on CKD in the setting of SBO   -Assessment and Plan: 1)SBO (small bowel obstruction) --POA -- Recommends continue NG tube and conservative approach for now - Patient denies prior surgeries.  Colonoscopy 2021 - diverticulosis, 1 polyp and internal hemorrhoids. -Gen Surgery consult from Dr. Collene Dawson appreciated--- 08/16/23 -No flatus, No BM - Continue IV Dextrose  fluid to avoid Hypoglycemia while awaiting return of bowel function and tolerance of oral intake   2)Persistent Hypoglycemia--- due to #1 above -Hold PTA insulin  - Blood glucose was down to the 50s---Now stable  with iv Dextrose  - Continuous IV dextrose  drip for now as above in # 1  3)AKI----acute kidney injury on CKD stage - IV--in the setting of dehydration with volume depletion and hypotension due to #1, compounded by losartan use PTA -  creatinine on admission= 12.37 - baseline creatinine =  3.1 (05/03/23)  ,  creatinine  13.40 ([place]) , -Hold PTA losartan - renally adjust medications, avoid nephrotoxic agents / dehydration  / hypotension - Status post right IJ temporary catheter placement on 08/14/2023 for possible HD treatments - Nephrology input appreciated -Initiated HD on 08/15/23 -Creatinine down to 9.84 on 08/16/2023 after hemodialysis on 08/15/2023  4)Aspiration Pneumonia/CAP---POA - Suspect due to #1 above with persistent emesis - Continue Rocephin /Doxy - prn bronchodilators - Supplemental oxygen as needed  5)HTN--was hypotensive due to volume depletion in setting of # 1 above - continue to Hold Carvedilol , Losartan  and Norvasc 5 mg  6)Hypocalcemia--when corrected for low albumin he has mild hypocalcemia --Replace and recheck  7)Hyperphosphatemia ---in the setting of # 1 above  Status is: Inpatient   Disposition: The patient is from: Home              Anticipated d/c is to: Home              Anticipated d/c date is: > 3 days              Patient currently is not medically stable to d/c. Barriers: Not Clinically Stable-   Code Status :  -  Code Status: Full Code   Family Communication:    (patient is alert, awake and coherent)  Discussed with S/o DEE at bedside  DVT Prophylaxis  :   - SCDs   SCDs Start: 08/13/23 2359  Lab Results  Component Value Date   PLT 222 08/14/2023   Inpatient Medications  Scheduled Meds:  Chlorhexidine  Gluconate Cloth  6 each Topical Daily   Chlorhexidine  Gluconate Cloth  6 each Topical Q0600   Continuous Infusions:  cefTRIAXone  (ROCEPHIN )  IV Stopped (08/16/23 0105)   dextrose  5 % and 0.45 % NaCl 75 mL/hr at 08/16/23 0324   doxycycline  (VIBRAMYCIN ) IV Stopped (08/16/23 0234)   PRN Meds:.acetaminophen  **OR** acetaminophen , albuterol , alteplase, feeding supplement (NEPRO CARB STEADY), heparin , HYDROmorphone  (DILAUDID ) injection, promethazine    Anti-infectives (From admission, onward)    Start     Dose/Rate Route Frequency Ordered Stop   08/14/23 0100  cefTRIAXone  (ROCEPHIN ) 2 g in sodium chloride  0.9 % 100 mL IVPB        2  g 200 mL/hr over 30 Minutes Intravenous Every 24 hours 08/14/23 0000     08/14/23 0100  doxycycline  (VIBRAMYCIN ) 100 mg in sodium chloride  0.9 % 250 mL IVPB        100 mg 125 mL/hr over 120 Minutes Intravenous Every 12 hours 08/14/23 0000        Subjective: [name] today has no fevers, No chest pain,    - No further emesis with NG in situ -No flatus, no BM - No significant abdominal pain at rest  Objective: Vitals:   08/16/23 0700 08/16/23 0731 08/16/23 0800 08/16/23 0900  BP: (!) 117/59  123/73 119/62  Pulse: 100   97 95  Resp: 16  17 18   Temp:  99.7 F (37.6 C)    TempSrc:  Oral    SpO2: 96%  96% 96%  Weight:      Height:        Intake/Output Summary (Last 24 hours) at 08/16/2023 0951 Last data filed at 08/16/2023 0324 Gross per 24 hour  Intake 1210.51 ml  Output 1825 ml  Net -614.49 ml   Filed Weights   08/13/23 2300 08/15/23 1900 08/15/23 2230  Weight: 111.1 kg 111.3 kg 111.3 kg   Physical Exam Gen:- Awake Alert,  in no apparent distress  HEENT:- [place].AT, No sclera icterus Nose-NG tube with gastric contents Neck-Supple Neck, right IJ HD catheter Lungs-  CTAB , fair symmetrical air movement CV- S1, S2 normal, regular  Abd-  Abd Soft, improving abdominal distention, Faint bowel sounds Extremity/Skin:- No  edema, pedal pulses present  Psych-affect is appropriate, oriented x3 Neuro-no new focal deficits, no tremors  Data Reviewed: I have personally reviewed following labs and imaging studies  CBC: Recent Labs  Lab 08/13/23 1858 08/14/23 0442  WBC 7.2 7.7  NEUTROABS 4.7  --   HGB 15.1 13.6  HCT 45.9 41.9  MCV 85.8 83.8  PLT 255 222   Basic Metabolic Panel: Recent Labs  Lab 08/13/23 1858 08/14/23 0442 08/15/23 0419 08/16/23 0255  NA 142 138 139 136  K 4.1 3.7 3.5 3.4*  CL 88* 89* 90* 93*  CO2 24 26 26 26   GLUCOSE 66* 144* 152* 142*  BUN 96* 116* 133* 93*  CREATININE 12.37* 12.85* 13.40* 9.84*  CALCIUM  7.8* 6.9* 6.4* 7.1*  MG  --  2.5*  --   --   PHOS  --   --  7.3* 5.7*   GFR: Estimated Creatinine Clearance: 8.9 mL/min (A) (by C-G formula based on SCr of 9.84 mg/dL (H)). Liver Function Tests: Recent Labs  Lab 08/13/23 1858 08/15/23 0419 08/16/23 0255  AST 18  --   --   ALT 25  --   --   ALKPHOS 61  --   --   BILITOT 0.7  --   --   PROT 7.8  --   --   ALBUMIN 3.5 2.5* 2.6*   HbA1C: Recent Labs    08/14/23 0442  HGBA1C 5.5    Recent Results (from the past 240 hours)  Blood culture (routine x 2)     Status: None (Preliminary result)   Collection  Time: 08/13/23  6:58 PM   Specimen: BLOOD  Result Value Ref Range Status   Specimen Description BLOOD RIGHT ANTECUBITAL  Final   Special Requests   Final    BOTTLES DRAWN AEROBIC AND ANAEROBIC Blood Culture adequate volume   Culture   Final    NO GROWTH 3 DAYS Performed at [facility],  [address], [place], [place] [address]    Report Status PENDING  Incomplete  Blood culture (routine x 2)     Status: None (Preliminary result)   Collection Time: 08/13/23  6:58 PM   Specimen: BLOOD  Result Value Ref Range Status   Specimen Description BLOOD LEFT ANTECUBITAL  Final   Special Requests   Final    BOTTLES DRAWN AEROBIC ONLY Blood Culture results may not be optimal due to an inadequate volume of blood received in culture bottles   Culture   Final    NO GROWTH 3 DAYS Performed at [facility], [address] [place], [place] [address]    Report Status PENDING  Incomplete  MRSA Next Gen by PCR, Nasal     Status: None   Collection Time: 08/13/23 11:36 PM   Specimen: Nasal Mucosa; Nasal Swab  Result Value Ref Range Status   MRSA by PCR Next Gen NOT DETECTED NOT DETECTED Final    Comment: (NOTE) The GeneXpert MRSA Assay (FDA approved for NASAL specimens only), is one component of a comprehensive MRSA colonization surveillance program. It is not intended to diagnose MRSA infection nor to guide or monitor treatment for MRSA infections. Test performance is not FDA approved in patients less than 27 years old. Performed at [facility], [address], [place], [place] [address]     Radiology Studies: [facility] 1 View Result Date: 08/14/2023 CLINICAL DATA:  Dialysis catheter placement EXAM: PORTABLE CHEST 1 VIEW COMPARISON:  January nineteen two FINDINGS: Right IJ dialysis catheter tip in the cavoatrial junction. No pneumothorax. Heart and mediastinum normal without significant infiltrates. Minimal left lower lobe hypoventilatory atelectatic changes Nasogastric tube in the body of the  stomach. IMPRESSION: Dialysis catheter as described Electronically Signed   By: Fredrich Jefferson M.D.   On: 08/14/2023 16:20   Scheduled Meds:  Chlorhexidine  Gluconate Cloth  6 each Topical Daily   Chlorhexidine  Gluconate Cloth  6 each Topical Q0600   Continuous Infusions:  cefTRIAXone  (ROCEPHIN )  IV Stopped (08/16/23 0105)   dextrose  5 % and 0.45 % NaCl 75 mL/hr at 08/16/23 0324   doxycycline  (VIBRAMYCIN ) IV Stopped (08/16/23 0234)    LOS: 3 days   Colin Dawley M.D on 08/16/2023 at 9:51 AM  Go to [email or web address] - for contact info  [facility] - Office  [phone]  If 7PM-7AM, please contact night-coverage [email or web address] 08/16/2023, 9:51 AM

## 2023-08-16 NOTE — Progress Notes (Signed)
 System clotted at last 15 mins.Keep even no UF off during treatment.  08/16/23 1714  Vitals  Temp 98.4 F (36.9 C)  Temp Source Oral  BP 117/70  BP Location Left Arm  BP Method Automatic  Patient Position (if appropriate) Lying  Pulse Rate 99  Oxygen Therapy  O2 Device Room Air  During Treatment Monitoring  HD Safety Checks Performed Yes  Intra-Hemodialysis Comments See progress note  Post Treatment  Dialyzer Clearance Clotted  Hemodialysis Intake (mL) 0 mL  Liters Processed 42  Fluid Removed (mL) 0 mL  Tolerated HD Treatment Yes  Post-Hemodialysis Comments System clotted.  Hemodialysis Catheter Right Internal jugular Triple lumen Temporary (Non-Tunneled)  Placement Date/Time: 08/14/23 1607   Placed prior to admission: No  Time Out: Correct patient;Correct site;Correct procedure  Maximum sterile barrier precautions: Hand hygiene;Cap;Mask;Sterile gown;Sterile gloves;Large sterile sheet;Sterile probe cove...  Site Condition No complications  Blue Lumen Status Heparin  locked  Red Lumen Status Heparin  locked  Catheter fill solution Heparin  1000 units/ml  Catheter fill volume (Arterial) 1.3 cc  Catheter fill volume (Venous) 1.3  Dressing Type Gauze/Drain sponge;Transparent  Dressing Status Antimicrobial disc/dressing in place;Clean, Dry, Intact  Interventions New dressing  Drainage Description None  Dressing Change Due 08/21/23  Post treatment catheter status Capped and Clamped

## 2023-08-16 NOTE — Progress Notes (Signed)
 Patient ID: Joseph Conrad, male   DOB: Nov 01, 1955, 68 y.o.   MRN: [id number] S: Tolerated HD yesterday, overall doing some better.  Hemodynamically stable.  UOP 1L yesterday.  Family bedside - 4 members.  O:BP 106/68 (BP Location: Left Arm)   Pulse (!) 103   Temp 98.2 F (36.8 C) (Oral)   Resp 16   Ht 5' 8 (1.727 m)   Wt 111 kg   SpO2 98%   BMI 37.21 kg/m   Intake/Output Summary (Last 24 hours) at 08/16/2023 1500 Last data filed at 08/16/2023 0324 Gross per 24 hour  Intake 1210.51 ml  Output 1825 ml  Net -614.49 ml   Intake/Output: I/O last 3 completed shifts: In: 3204.7 [I.V.:2254.3; IV Piggyback:950.4] Out: 3425 [Urine:1025; Emesis/NG output:2400]  Intake/Output this shift:  No intake/output data recorded. Weight change:  Gen: NAD in chair CVS: RRR Resp:CTA Abd: distended, hypoactive bowel sounds, nontender, NG tube in place Ext: no edema  Recent Labs  Lab 08/13/23 1858 08/14/23 0442 08/15/23 0419 08/16/23 0255  NA 142 138 139 136  K 4.1 3.7 3.5 3.4*  CL 88* 89* 90* 93*  CO2 24 26 26 26   GLUCOSE 66* 144* 152* 142*  BUN 96* 116* 133* 93*  CREATININE 12.37* 12.85* 13.40* 9.84*  ALBUMIN 3.5  --  2.5* 2.6*  CALCIUM  7.8* 6.9* 6.4* 7.1*  PHOS  --   --  7.3* 5.7*  AST 18  --   --   --   ALT 25  --   --   --    Liver Function Tests: Recent Labs  Lab 08/13/23 1858 08/15/23 0419 08/16/23 0255  AST 18  --   --   ALT 25  --   --   ALKPHOS 61  --   --   BILITOT 0.7  --   --   PROT 7.8  --   --   ALBUMIN 3.5 2.5* 2.6*   Recent Labs  Lab 08/13/23 1858  LIPASE 61*   No results for input(s): AMMONIA in the last 168 hours. CBC: Recent Labs  Lab 08/13/23 1858 08/14/23 0442  WBC 7.2 7.7  NEUTROABS 4.7  --   HGB 15.1 13.6  HCT 45.9 41.9  MCV 85.8 83.8  PLT 255 222   Cardiac Enzymes: No results for input(s): CKTOTAL, CKMB, CKMBINDEX, TROPONINI in the last 168 hours. CBG: Recent Labs  Lab 08/14/23 1605 08/14/23 2354 08/15/23 0743  08/15/23 1554 08/16/23 0733  GLUCAP 158* 132* 179* 171* 136*    Iron Studies: No results for input(s): IRON, TIBC, TRANSFERRIN, FERRITIN in the last 72 hours. Studies/Results: DG Abd Portable 1V Result Date: 08/16/2023 CLINICAL DATA:  Enteric catheter placement EXAM: PORTABLE ABDOMEN - 1 VIEW COMPARISON:  08/13/2023 FINDINGS: Frontal view of the lower chest and upper abdomen demonstrates enteric catheter passing below diaphragm, tip overlying the gastric fundus. Right internal jugular catheter tip overlies atriocaval junction. Lung bases are clear. Nonspecific gaseous distention of the bowel is noted. IMPRESSION: 1. Enteric catheter tip projecting over gastric fundus. Electronically Signed   By: Bobbye Burrow M.D.   On: 08/16/2023 13:14   DG CHEST PORT 1 VIEW Result Date: 08/16/2023 CLINICAL DATA:  [id number] Encounter for nasogastric (NG) tube placement [id number] EXAM: PORTABLE CHEST 1 VIEW COMPARISON:  August 14, 2023 FINDINGS: Incomplete visualization of the abdomen. Enteric tube is coiled over the distal esophagus with tip coursing superiorly towards the neck beyond the field of view. RIGHT IJ CVC tip terminates over the inferior  SVC. Gaseous distension of visualized bowel loops. IMPRESSION: Enteric tube is coiled over the distal esophagus with tip coursing superiorly towards the neck beyond the field of view. Recommend repositioning. Electronically Signed   By: Clancy Crimes M.D.   On: 08/16/2023 12:15   DG Chest Port 1 View Result Date: 08/14/2023 CLINICAL DATA:  Dialysis catheter placement EXAM: PORTABLE CHEST 1 VIEW COMPARISON:  January nineteen two FINDINGS: Right IJ dialysis catheter tip in the cavoatrial junction. No pneumothorax. Heart and mediastinum normal without significant infiltrates. Minimal left lower lobe hypoventilatory atelectatic changes Nasogastric tube in the body of the stomach. IMPRESSION: Dialysis catheter as described Electronically Signed   By: Fredrich Jefferson M.D.    On: 08/14/2023 16:20    Chlorhexidine  Gluconate Cloth  6 each Topical Daily   Chlorhexidine  Gluconate Cloth  6 each Topical Q0600   potassium chloride  40 mEq Per Tube Q3H    BMET    Component Value Date/Time   NA 136 08/16/2023 0255   K 3.4 (L) 08/16/2023 0255   CL 93 (L) 08/16/2023 0255   CO2 26 08/16/2023 0255   GLUCOSE 142 (H) 08/16/2023 0255   GLUCOSE 124 (H) 12/30/2005 0906   BUN 93 (H) 08/16/2023 0255   CREATININE 9.84 (H) 08/16/2023 0255   CALCIUM  7.1 (L) 08/16/2023 0255   GFRNONAA 5 (L) 08/16/2023 0255   GFRAA 32 (L) 02/12/2019 0533   CBC    Component Value Date/Time   WBC 7.7 08/14/2023 0442   RBC 5.00 08/14/2023 0442   HGB 13.6 08/14/2023 0442   HCT 41.9 08/14/2023 0442   PLT 222 08/14/2023 0442   MCV 83.8 08/14/2023 0442   MCH 27.2 08/14/2023 0442   MCHC 32.5 08/14/2023 0442   RDW 14.6 08/14/2023 0442   LYMPHSABS 1.3 08/13/2023 1858   MONOABS 1.1 (H) 08/13/2023 1858   EOSABS 0.0 08/13/2023 1858   BASOSABS 0.0 08/13/2023 1858    Assessment/Plan:  AKI/CKD stage IV, oliguric - likely ischemic ATN in setting of volume depletion and hypotension from N/V/SBO and concomitant ARB therapy.  Due to progressive azotemia he had temp HD cath placed by Dr Collene Dawson 6/12 followed by 1st HD 6/13 and we will do another today.  Remains on IVF - cont for now and no UF with HD.  His UOP is rising which is encouraging and we will follow daily labs  Avoid nephrotoxic medications including NSAIDs and iodinated intravenous contrast exposure unless the latter is absolutely indicated.   Preferred narcotic agents for pain control are hydromorphone , fentanyl , and methadone. Morphine  should not be used.  Avoid Baclofen and avoid oral sodium phosphate  and magnesium citrate based laxatives / bowel preps.  Continue strict Input and Output monitoring. Will monitor the patient closely with you and intervene or adjust therapy as indicated by changes in clinical status/labs   SBO - NGT in place.   Surgery consulted - no operative intervention needed at this time.  Cont NG tube for now. DM with hypoglycemia - started on D5 1/2 normal saline drip. Sodium remains normal so ok to continue for now with daily labs.  HTN - blood pressure is low likely due to severe volume depletion.  Continue with IVF's and continue to hold ARB. Aspiration pneumonia - on abx per primary.  Patient will not be seen tomorrow but will review chart.  Please reach out with concerns.   Adrian Alba MD [facility] Pager [phone]

## 2023-08-17 DIAGNOSIS — K56609 Unspecified intestinal obstruction, unspecified as to partial versus complete obstruction: Secondary | ICD-10-CM | POA: Diagnosis not present

## 2023-08-17 LAB — BASIC METABOLIC PANEL WITH GFR
Anion gap: 16 — ABNORMAL HIGH (ref 5–15)
BUN: 75 mg/dL — ABNORMAL HIGH (ref 8–23)
CO2: 26 mmol/L (ref 22–32)
Calcium: 7.4 mg/dL — ABNORMAL LOW (ref 8.9–10.3)
Chloride: 95 mmol/L — ABNORMAL LOW (ref 98–111)
Creatinine, Ser: 7.4 mg/dL — ABNORMAL HIGH (ref 0.61–1.24)
GFR, Estimated: 8 mL/min — ABNORMAL LOW (ref >60)
Glucose, Bld: 130 mg/dL — ABNORMAL HIGH (ref 70–99)
Potassium: 3.3 mmol/L — ABNORMAL LOW (ref 3.5–5.1)
Sodium: 137 mmol/L (ref 135–145)

## 2023-08-17 LAB — MAGNESIUM: Magnesium: 2.1 mg/dL (ref 1.7–2.4)

## 2023-08-17 LAB — GLUCOSE, CAPILLARY
Glucose-Capillary: 125 mg/dL — ABNORMAL HIGH (ref 70–99)
Glucose-Capillary: 129 mg/dL — ABNORMAL HIGH (ref 70–99)
Glucose-Capillary: 130 mg/dL — ABNORMAL HIGH (ref 70–99)
Glucose-Capillary: 133 mg/dL — ABNORMAL HIGH (ref 70–99)
Glucose-Capillary: 147 mg/dL — ABNORMAL HIGH (ref 70–99)

## 2023-08-17 LAB — POTASSIUM: Potassium: 4.2 mmol/L (ref 3.5–5.1)

## 2023-08-17 MED ORDER — BISACODYL 10 MG RE SUPP
10.0000 mg | Freq: Two times a day (BID) | RECTAL | Status: DC
  Administered 2023-08-17 – 2023-08-21 (×10): 10 mg via RECTAL
  Filled 2023-08-17 (×10): qty 1

## 2023-08-17 MED ORDER — PHENOL 1.4 % MT LIQD
1.0000 | OROMUCOSAL | Status: DC | PRN
  Administered 2023-08-20: 1 via OROMUCOSAL
  Filled 2023-08-17: qty 177

## 2023-08-17 MED ORDER — POTASSIUM CHLORIDE 10 MEQ/100ML IV SOLN: 10.0000 meq | INTRAVENOUS | Status: DC

## 2023-08-17 MED ORDER — POTASSIUM CHLORIDE 20 MEQ PO PACK
40.0000 meq | PACK | ORAL | Status: AC
  Administered 2023-08-17 (×2): 40 meq
  Filled 2023-08-17 (×2): qty 2

## 2023-08-17 MED ORDER — DEXTROSE-SODIUM CHLORIDE 5-0.45 % IV SOLN: INTRAVENOUS | Status: DC

## 2023-08-17 MED ORDER — POTASSIUM CHLORIDE CRYS ER 20 MEQ PO TBCR: 40.0000 meq | EXTENDED_RELEASE_TABLET | ORAL | Status: DC

## 2023-08-17 NOTE — Plan of Care (Signed)

## 2023-08-17 NOTE — Progress Notes (Signed)
 PROGRESS NOTE  Joseph Conrad, is a 68 y.o. male, DOB - 1955-05-30, ONG:[id number]  Admit date - 08/13/2023   Admitting Physician Ejiroghene Debroah Fanning, MD  Outpatient Primary MD for the patient is [facility], [facility]  LOS - 4  CC---no urine     Brief Narrative:  68 y.o. male with medical history significant for CKD IV, diabetes mellitus, hypertension, OSA admitted on 08/13/2023 with AKI on CKD in the setting of SBO   -Assessment and Plan: 1)SBO (small bowel obstruction) --POA -- Recommends continue NG tube and conservative approach for now - Patient denies prior surgeries.  Colonoscopy 2021 - diverticulosis, 1 polyp and internal hemorrhoids. -Gen Surgery consult from Dr. Collene Dawson appreciated--- 08/17/23 -No flatus, No BM - Continue IV Dextrose  fluid to avoid Hypoglycemia while awaiting return of bowel function and tolerance of oral intake - Give Dulcolax suppository  - Anticipate repeat KUB in a.m.  2)Persistent Hypoglycemia--- due to #1 above -Hold PTA insulin  - Blood glucose was down to the 50s---Now stable  with iv Dextrose  - Continuous IV dextrose  drip for now as above in # 1  3)AKI----acute kidney injury on CKD stage - IV--in the setting of dehydration with volume depletion and hypotension due to #1, compounded by losartan use PTA -  creatinine on admission= 12.37 - baseline creatinine =  3.1 (05/03/23)  ,  creatinine  13.40 ([place]) , -Hold PTA losartan - renally adjust medications, avoid nephrotoxic agents / dehydration  / hypotension - Status post right IJ temporary catheter placement on 08/14/2023   - Nephrology input appreciated -Initiated HD on 08/15/23 08/17/23 - Having urine output - Last HD 08/16/2023  4)Aspiration Pneumonia/CAP---POA - Suspect due to #1 above with persistent emesis - Continue Rocephin /Doxy - prn bronchodilators - Supplemental oxygen as needed  5)HTN--was hypotensive due to volume depletion in setting of # 1 above - continue to Hold  Carvedilol , Losartan and Norvasc 5 mg  6)Hypocalcemia--when corrected for low albumin he has mild hypocalcemia --Replace and recheck  7)Hyperphosphatemia ---in the setting of # 1 above  Status is: Inpatient   Disposition: The patient is from: Home              Anticipated d/c is to: Home              Anticipated d/c date is: > 3 days              Patient currently is not medically stable to d/c. Barriers: Not Clinically Stable-   Code Status :  -  Code Status: Full Code   Family Communication:    (patient is alert, awake and coherent)  Discussed with S/o DEE at bedside  DVT Prophylaxis  :   - SCDs   SCDs Start: 08/13/23 2359  Lab Results  Component Value Date   PLT 222 08/14/2023   Inpatient Medications  Scheduled Meds:  bisacodyl  10 mg Rectal BID   Chlorhexidine  Gluconate Cloth  6 each Topical Daily   Chlorhexidine  Gluconate Cloth  6 each Topical Q0600   Continuous Infusions:  cefTRIAXone  (ROCEPHIN )  IV 2 g (08/17/23 0034)   dextrose  5 % and 0.45 % NaCl     doxycycline  (VIBRAMYCIN ) IV 100 mg (08/17/23 1236)   PRN Meds:.acetaminophen  **OR** acetaminophen , albuterol , HYDROmorphone  (DILAUDID ) injection, phenol, promethazine    Anti-infectives (From admission, onward)    Start     Dose/Rate Route Frequency Ordered Stop   08/14/23 0100  cefTRIAXone  (ROCEPHIN ) 2 g in sodium chloride  0.9 % 100 mL IVPB  2 g 200 mL/hr over 30 Minutes Intravenous Every 24 hours 08/14/23 0000     08/14/23 0100  doxycycline  (VIBRAMYCIN ) 100 mg in sodium chloride  0.9 % 250 mL IVPB        100 mg 125 mL/hr over 120 Minutes Intravenous Every 12 hours 08/14/23 0000        Subjective: [name] today has no fevers, No chest pain,    - No further emesis with NG in situ (NG tube fell out and was replaced again) -No flatus, no BM --Tolerated hemodialysis well on 08/16/2023 - Able to ambulate in the hallways  Objective: Vitals:   08/17/23 0900 08/17/23 1102 08/17/23 1200  08/17/23 1300  BP: 125/74  (!) 116/58 122/79  Pulse: 98  93 94  Resp: 19  16 15   Temp:  98.2 F (36.8 C)    TempSrc:  Oral    SpO2: 100%  98% 99%  Weight:      Height:        Intake/Output Summary (Last 24 hours) at 08/17/2023 1333 Last data filed at 08/17/2023 1230 Gross per 24 hour  Intake 1566.11 ml  Output 3125 ml  Net -1558.89 ml   Filed Weights   08/16/23 1442 08/16/23 1713 08/17/23 0458  Weight: 111 kg 111.2 kg 111.2 kg   Physical Exam Gen:- Awake Alert,  in no apparent distress  HEENT:- [place].AT, No sclera icterus Nose-NG tube in situ Neck-Supple Neck, right IJ HD catheter Lungs-  CTAB , fair symmetrical air movement CV- S1, S2 normal, regular  Abd-  Abd Soft, improving abdominal distention, Faint bowel sounds Extremity/Skin:- No  edema, pedal pulses present  Psych-affect is appropriate, oriented x3 Neuro-no new focal deficits, no tremors  Data Reviewed: I have personally reviewed following labs and imaging studies  CBC: Recent Labs  Lab 08/13/23 1858 08/14/23 0442  WBC 7.2 7.7  NEUTROABS 4.7  --   HGB 15.1 13.6  HCT 45.9 41.9  MCV 85.8 83.8  PLT 255 222   Basic Metabolic Panel: Recent Labs  Lab 08/13/23 1858 08/14/23 0442 08/15/23 0419 08/16/23 0255 08/17/23 0211  NA 142 138 139 136 137  K 4.1 3.7 3.5 3.4* 3.3*  CL 88* 89* 90* 93* 95*  CO2 24 26 26 26 26   GLUCOSE 66* 144* 152* 142* 130*  BUN 96* 116* 133* 93* 75*  CREATININE 12.37* 12.85* 13.40* 9.84* 7.40*  CALCIUM  7.8* 6.9* 6.4* 7.1* 7.4*  MG  --  2.5*  --   --  2.1  PHOS  --   --  7.3* 5.7*  --    GFR: Estimated Creatinine Clearance: 11.9 mL/min (A) (by C-G formula based on SCr of 7.4 mg/dL (H)). Liver Function Tests: Recent Labs  Lab 08/13/23 1858 08/15/23 0419 08/16/23 0255  AST 18  --   --   ALT 25  --   --   ALKPHOS 61  --   --   BILITOT 0.7  --   --   PROT 7.8  --   --   ALBUMIN 3.5 2.5* 2.6*    Recent Results (from the past 240 hours)  Blood culture (routine x 2)      Status: None (Preliminary result)   Collection Time: 08/13/23  6:58 PM   Specimen: BLOOD  Result Value Ref Range Status   Specimen Description BLOOD RIGHT ANTECUBITAL  Final   Special Requests   Final    BOTTLES DRAWN AEROBIC AND ANAEROBIC Blood Culture adequate volume   Culture  Final    NO GROWTH 3 DAYS Performed at [facility], [address] [place], [place] [address]    Report Status PENDING  Incomplete  Blood culture (routine x 2)     Status: None (Preliminary result)   Collection Time: 08/13/23  6:58 PM   Specimen: BLOOD  Result Value Ref Range Status   Specimen Description BLOOD LEFT ANTECUBITAL  Final   Special Requests   Final    BOTTLES DRAWN AEROBIC ONLY Blood Culture results may not be optimal due to an inadequate volume of blood received in culture bottles   Culture   Final    NO GROWTH 3 DAYS Performed at [facility], [address] [place], [place] [address]    Report Status PENDING  Incomplete  MRSA Next Gen by PCR, Nasal     Status: None   Collection Time: 08/13/23 11:36 PM   Specimen: Nasal Mucosa; Nasal Swab  Result Value Ref Range Status   MRSA by PCR Next Gen NOT DETECTED NOT DETECTED Final    Comment: (NOTE) The GeneXpert MRSA Assay (FDA approved for NASAL specimens only), is one component of a comprehensive MRSA colonization surveillance program. It is not intended to diagnose MRSA infection nor to guide or monitor treatment for MRSA infections. Test performance is not FDA approved in patients less than 11 years old. Performed at [facility], [address] [place], [place] [address]     Radiology Studies: DG Abd Portable 1V Result Date: 08/16/2023 CLINICAL DATA:  Enteric catheter placement EXAM: PORTABLE ABDOMEN - 1 VIEW COMPARISON:  08/13/2023 FINDINGS: Frontal view of the lower chest and upper abdomen demonstrates enteric catheter passing below diaphragm, tip overlying the gastric fundus. Right internal jugular catheter tip overlies atriocaval  junction. Lung bases are clear. Nonspecific gaseous distention of the bowel is noted. IMPRESSION: 1. Enteric catheter tip projecting over gastric fundus. Electronically Signed   By: Bobbye Burrow M.D.   On: 08/16/2023 13:14   DG CHEST PORT 1 VIEW Result Date: 08/16/2023 CLINICAL DATA:  [id number] Encounter for nasogastric (NG) tube placement [id number] EXAM: PORTABLE CHEST 1 VIEW COMPARISON:  August 14, 2023 FINDINGS: Incomplete visualization of the abdomen. Enteric tube is coiled over the distal esophagus with tip coursing superiorly towards the neck beyond the field of view. RIGHT IJ CVC tip terminates over the inferior SVC. Gaseous distension of visualized bowel loops. IMPRESSION: Enteric tube is coiled over the distal esophagus with tip coursing superiorly towards the neck beyond the field of view. Recommend repositioning. Electronically Signed   By: Clancy Crimes M.D.   On: 08/16/2023 12:15   Scheduled Meds:  bisacodyl  10 mg Rectal BID   Chlorhexidine  Gluconate Cloth  6 each Topical Daily   Chlorhexidine  Gluconate Cloth  6 each Topical Q0600   Continuous Infusions:  cefTRIAXone  (ROCEPHIN )  IV 2 g (08/17/23 0034)   dextrose  5 % and 0.45 % NaCl     doxycycline  (VIBRAMYCIN ) IV 100 mg (08/17/23 1236)    LOS: 4 days   Colin Dawley M.D on 08/17/2023 at 1:33 PM  Go to [email or web address] - for contact info  [facility] - Office  [phone]  If 7PM-7AM, please contact night-coverage [email or web address] 08/17/2023, 1:33 PM

## 2023-08-17 NOTE — Progress Notes (Signed)
 Patient ID: Joseph Conrad, male   DOB: Jun 14, 1955, 68 y.o.   MRN: [id number] S: Tolerated 2nd HD yesterday, overall doing some better but NG tube remains to suction and no BM yet.  Hemodynamically stable.  UOP ~1L yesterday.  Family bedside. O:BP (!) 142/70   Pulse 98   Temp 98.2 F (36.8 C) (Oral)   Resp 16   Ht 5' 8 (1.727 m)   Wt 111.2 kg   SpO2 96%   BMI 37.28 kg/m   Intake/Output Summary (Last 24 hours) at 08/17/2023 1458 Last data filed at 08/17/2023 1230 Gross per 24 hour  Intake 1326.11 ml  Output 2525 ml  Net -1198.89 ml   Intake/Output: I/O last 3 completed shifts: In: 2286.6 [I.V.:1396.3; NG/GT:290; IV Piggyback:600.4] Out: 3425 [Urine:1225; Emesis/NG output:2200]  Intake/Output this shift:  Total I/O In: 240 [NG/GT:240] Out: -  Weight change: -0.3 kg Gen: NAD in bed CVS: RRR Resp:CTA Abd: distended, hypoactive bowel sounds, nontender, NG tube in place Ext: no edema  Recent Labs  Lab 08/13/23 1858 08/14/23 0442 08/15/23 0419 08/16/23 0255 08/17/23 0211  NA 142 138 139 136 137  K 4.1 3.7 3.5 3.4* 3.3*  CL 88* 89* 90* 93* 95*  CO2 24 26 26 26 26   GLUCOSE 66* 144* 152* 142* 130*  BUN 96* 116* 133* 93* 75*  CREATININE 12.37* 12.85* 13.40* 9.84* 7.40*  ALBUMIN 3.5  --  2.5* 2.6*  --   CALCIUM  7.8* 6.9* 6.4* 7.1* 7.4*  PHOS  --   --  7.3* 5.7*  --   AST 18  --   --   --   --   ALT 25  --   --   --   --    Liver Function Tests: Recent Labs  Lab 08/13/23 1858 08/15/23 0419 08/16/23 0255  AST 18  --   --   ALT 25  --   --   ALKPHOS 61  --   --   BILITOT 0.7  --   --   PROT 7.8  --   --   ALBUMIN 3.5 2.5* 2.6*   Recent Labs  Lab 08/13/23 1858  LIPASE 61*   No results for input(s): AMMONIA in the last 168 hours. CBC: Recent Labs  Lab 08/13/23 1858 08/14/23 0442  WBC 7.2 7.7  NEUTROABS 4.7  --   HGB 15.1 13.6  HCT 45.9 41.9  MCV 85.8 83.8  PLT 255 222   Cardiac Enzymes: No results for input(s): CKTOTAL, CKMB, CKMBINDEX,  TROPONINI in the last 168 hours. CBG: Recent Labs  Lab 08/15/23 1554 08/16/23 0733 08/17/23 0022 08/17/23 0723 08/17/23 1346  GLUCAP 171* 136* 130* 125* 147*    Iron Studies: No results for input(s): IRON, TIBC, TRANSFERRIN, FERRITIN in the last 72 hours. Studies/Results: DG Abd Portable 1V Result Date: 08/16/2023 CLINICAL DATA:  Enteric catheter placement EXAM: PORTABLE ABDOMEN - 1 VIEW COMPARISON:  08/13/2023 FINDINGS: Frontal view of the lower chest and upper abdomen demonstrates enteric catheter passing below diaphragm, tip overlying the gastric fundus. Right internal jugular catheter tip overlies atriocaval junction. Lung bases are clear. Nonspecific gaseous distention of the bowel is noted. IMPRESSION: 1. Enteric catheter tip projecting over gastric fundus. Electronically Signed   By: Bobbye Burrow M.D.   On: 08/16/2023 13:14   DG CHEST PORT 1 VIEW Result Date: 08/16/2023 CLINICAL DATA:  [id number] Encounter for nasogastric (NG) tube placement [id number] EXAM: PORTABLE CHEST 1 VIEW COMPARISON:  August 14, 2023 FINDINGS: Incomplete visualization  of the abdomen. Enteric tube is coiled over the distal esophagus with tip coursing superiorly towards the neck beyond the field of view. RIGHT IJ CVC tip terminates over the inferior SVC. Gaseous distension of visualized bowel loops. IMPRESSION: Enteric tube is coiled over the distal esophagus with tip coursing superiorly towards the neck beyond the field of view. Recommend repositioning. Electronically Signed   By: Clancy Crimes M.D.   On: 08/16/2023 12:15    bisacodyl  10 mg Rectal BID   Chlorhexidine  Gluconate Cloth  6 each Topical Daily   Chlorhexidine  Gluconate Cloth  6 each Topical Q0600    BMET    Component Value Date/Time   NA 137 08/17/2023 0211   K 3.3 (L) 08/17/2023 0211   CL 95 (L) 08/17/2023 0211   CO2 26 08/17/2023 0211   GLUCOSE 130 (H) 08/17/2023 0211   GLUCOSE 124 (H) 12/30/2005 0906   BUN 75 (H) 08/17/2023 0211    CREATININE 7.40 (H) 08/17/2023 0211   CALCIUM  7.4 (L) 08/17/2023 0211   GFRNONAA 8 (L) 08/17/2023 0211   GFRAA 32 (L) 02/12/2019 0533   CBC    Component Value Date/Time   WBC 7.7 08/14/2023 0442   RBC 5.00 08/14/2023 0442   HGB 13.6 08/14/2023 0442   HCT 41.9 08/14/2023 0442   PLT 222 08/14/2023 0442   MCV 83.8 08/14/2023 0442   MCH 27.2 08/14/2023 0442   MCHC 32.5 08/14/2023 0442   RDW 14.6 08/14/2023 0442   LYMPHSABS 1.3 08/13/2023 1858   MONOABS 1.1 (H) 08/13/2023 1858   EOSABS 0.0 08/13/2023 1858   BASOSABS 0.0 08/13/2023 1858    Assessment/Plan:  AKI/CKD stage IV, oliguric - likely ischemic ATN in setting of volume depletion and hypotension from N/V/SBO and concomitant ARB therapy.  Due to progressive azotemia he had temp HD cath placed by Dr Collene Dawson 6/12 followed by 1st HD 6/13, 2nd 6/14.  Remains on IVF - cont for now.  His UOP is rising which is encouraging and we will follow daily labs but based on todays BUN 75/Cr 7.4 he'll need another tx tomorrow - will write orders but have RN review with MD prior to completing in case recovering.   Avoid nephrotoxic medications including NSAIDs and iodinated intravenous contrast exposure unless the latter is absolutely indicated.   Preferred narcotic agents for pain control are hydromorphone , fentanyl , and methadone. Morphine  should not be used.  Avoid Baclofen and avoid oral sodium phosphate  and magnesium citrate based laxatives / bowel preps.  Continue strict Input and Output monitoring. Will monitor the patient closely with you and intervene or adjust therapy as indicated by changes in clinical status/labs   SBO - NGT in place.  Surgery consulted - no operative intervention needed at this time.  Cont NG tube for now. DM with hypoglycemia - remains on D5 1/2 normal saline drip. Sodium remains normal so ok to continue for now with daily labs.  HTN - now normotensive.  Continue with IVF's and continue to hold ARB. Aspiration pneumonia  - on abx per primary.   Adrian Alba MD [facility] Pager [phone]

## 2023-08-17 NOTE — Progress Notes (Signed)
 [facility] Progress Note     Subjective: Doing fair. No BM or flatus. Is ambulating in the hall. NG came out and was replaced yesterday.   Objective: Vital signs in last 24 hours: Temp:  [98.2 F (36.8 C)-98.8 F (37.1 C)] 98.2 F (36.8 C) (06/15 1102) Pulse Rate:  [93-110] 93 (06/15 1200) Resp:  [10-25] 16 (06/15 1200) BP: (102-163)/(58-142) 116/58 (06/15 1200) SpO2:  [93 %-100 %] 98 % (06/15 1200) Weight:  [409 kg-111.2 kg] 111.2 kg (06/15 0458) Last BM Date : 08/09/23  Intake/Output from previous day: 06/14 0701 - 06/15 0700 In: 1326.1 [I.V.:786.1; NG/GT:290; IV Piggyback:250] Out: 3125 [Urine:925; Emesis/NG output:2200] Intake/Output this shift: Total I/O In: 240 [NG/GT:240] Out: -   General appearance: alert and no distress GI: soft, nondistended, non tender   Lab Results:  No results for input(s): WBC, HGB, HCT, PLT in the last 72 hours. BMET Recent Labs    08/16/23 0255 08/17/23 0211  NA 136 137  K 3.4* 3.3*  CL 93* 95*  CO2 26 26  GLUCOSE 142* 130*  BUN 93* 75*  CREATININE 9.84* 7.40*  CALCIUM  7.1* 7.4*   PT/INR No results for input(s): LABPROT, INR in the last 72 hours.  Studies/Results: DG Abd Portable 1V Result Date: 08/16/2023 CLINICAL DATA:  Enteric catheter placement EXAM: PORTABLE ABDOMEN - 1 VIEW COMPARISON:  08/13/2023 FINDINGS: Frontal view of the lower chest and upper abdomen demonstrates enteric catheter passing below diaphragm, tip overlying the gastric fundus. Right internal jugular catheter tip overlies atriocaval junction. Lung bases are clear. Nonspecific gaseous distention of the bowel is noted. IMPRESSION: 1. Enteric catheter tip projecting over gastric fundus. Electronically Signed   By: Bobbye Burrow M.D.   On: 08/16/2023 13:14   DG CHEST PORT 1 VIEW Result Date: 08/16/2023 CLINICAL DATA:  [id number] Encounter for nasogastric (NG) tube placement [id number] EXAM: PORTABLE CHEST 1 VIEW COMPARISON:  August 14, 2023 FINDINGS: Incomplete visualization of the abdomen. Enteric tube is coiled over the distal esophagus with tip coursing superiorly towards the neck beyond the field of view. RIGHT IJ CVC tip terminates over the inferior SVC. Gaseous distension of visualized bowel loops. IMPRESSION: Enteric tube is coiled over the distal esophagus with tip coursing superiorly towards the neck beyond the field of view. Recommend repositioning. Electronically Signed   By: Clancy Crimes M.D.   On: 08/16/2023 12:15    Anti-infectives: Anti-infectives (From admission, onward)    Start     Dose/Rate Route Frequency Ordered Stop   08/14/23 0100  cefTRIAXone  (ROCEPHIN ) 2 g in sodium chloride  0.9 % 100 mL IVPB        2 g 200 mL/hr over 30 Minutes Intravenous Every 24 hours 08/14/23 0000     08/14/23 0100  doxycycline  (VIBRAMYCIN ) 100 mg in sodium chloride  0.9 % 250 mL IVPB        100 mg 125 mL/hr over 120 Minutes Intravenous Every 12 hours 08/14/23 0000         Assessment/Plan: Patient with SBO. AKI improving with UOP and HD.  NPO, NG Chloraseptic spray Dulcolax suppository BID Ambulate Can have ice chips while on suction    LOS: 4 days    Awilda Bogus 08/17/2023

## 2023-08-18 ENCOUNTER — Inpatient Hospital Stay (HOSPITAL_COMMUNITY)

## 2023-08-18 DIAGNOSIS — K56609 Unspecified intestinal obstruction, unspecified as to partial versus complete obstruction: Secondary | ICD-10-CM | POA: Diagnosis not present

## 2023-08-18 LAB — CULTURE, BLOOD (ROUTINE X 2)
Culture: NO GROWTH
Culture: NO GROWTH
Special Requests: ADEQUATE

## 2023-08-18 LAB — CBC
HCT: 37.9 % — ABNORMAL LOW (ref 39.0–52.0)
Hemoglobin: 12.5 g/dL — ABNORMAL LOW (ref 13.0–17.0)
MCH: 28.5 pg (ref 26.0–34.0)
MCHC: 33 g/dL (ref 30.0–36.0)
MCV: 86.5 fL (ref 80.0–100.0)
Platelets: 156 10*3/uL (ref 150–400)
RBC: 4.38 MIL/uL (ref 4.22–5.81)
RDW: 13.6 % (ref 11.5–15.5)
WBC: 7.9 10*3/uL (ref 4.0–10.5)
nRBC: 0 % (ref 0.0–0.2)

## 2023-08-18 LAB — GLUCOSE, CAPILLARY
Glucose-Capillary: 124 mg/dL — ABNORMAL HIGH (ref 70–99)
Glucose-Capillary: 135 mg/dL — ABNORMAL HIGH (ref 70–99)
Glucose-Capillary: 175 mg/dL — ABNORMAL HIGH (ref 70–99)
Glucose-Capillary: 150 mg/dL — ABNORMAL HIGH (ref 70–99)

## 2023-08-18 LAB — RENAL FUNCTION PANEL
Albumin: 2.5 g/dL — ABNORMAL LOW (ref 3.5–5.0)
Anion gap: 14 (ref 5–15)
BUN: 84 mg/dL — ABNORMAL HIGH (ref 8–23)
CO2: 26 mmol/L (ref 22–32)
Calcium: 8.5 mg/dL — ABNORMAL LOW (ref 8.9–10.3)
Chloride: 102 mmol/L (ref 98–111)
Creatinine, Ser: 7.4 mg/dL — ABNORMAL HIGH (ref 0.61–1.24)
GFR, Estimated: 8 mL/min — ABNORMAL LOW (ref >60)
Glucose, Bld: 143 mg/dL — ABNORMAL HIGH (ref 70–99)
Phosphorus: 6.1 mg/dL — ABNORMAL HIGH (ref 2.5–4.6)
Potassium: 3.9 mmol/L (ref 3.5–5.1)
Sodium: 142 mmol/L (ref 135–145)

## 2023-08-18 MED ORDER — DEXTROSE-SODIUM CHLORIDE 5-0.45 % IV SOLN: INTRAVENOUS | Status: DC

## 2023-08-18 NOTE — Progress Notes (Signed)
 [facility] Progress Note     Subjective: Minor BM with suppository. HD today. Some flatus. Kub with dilated bowel still.   Objective: Vital signs in last 24 hours: Temp:  [98.1 F (36.7 C)-98.2 F (36.8 C)] 98.2 F (36.8 C) (06/16 0738) Pulse Rate:  [83-99] 95 (06/16 0900) Resp:  [11-26] 15 (06/16 0900) BP: (110-163)/(58-88) 116/73 (06/16 0900) SpO2:  [91 %-100 %] 95 % (06/16 0900) Last BM Date : 08/17/23  Intake/Output from previous day: 06/15 0701 - 06/16 0700 In: 1068.4 [I.V.:169.5; NG/GT:290; IV Piggyback:609] Out: 1800 [Urine:1100; Emesis/NG output:700] Intake/Output this shift: No intake/output data recorded.  General appearance: alert and no distress GI: soft, nondistended, nontender   Lab Results:  Recent Labs    08/18/23 0424  WBC 7.9  HGB 12.5*  HCT 37.9*  PLT 156   BMET Recent Labs    08/17/23 0211 08/17/23 1553 08/18/23 0424  NA 137  --  142  K 3.3* 4.2 3.9  CL 95*  --  102  CO2 26  --  26  GLUCOSE 130*  --  143*  BUN 75*  --  84*  CREATININE 7.40*  --  7.40*  CALCIUM  7.4*  --  8.5*   PT/INR No results for input(s): LABPROT, INR in the last 72 hours.  Studies/Results: DG Abd 1 View Result Date: 08/18/2023 CLINICAL DATA:  Bowel obstruction EXAM: ABDOMEN - 1 VIEW COMPARISON:  August 16, 2023 FINDINGS: Multiple persistently dilated bowel loops in the central portion of the abdomen could correlate with partial or early small bowel obstruction. IMPRESSION: Multiple persistently dilated bowel loops in the central portion of the abdomen could correlate with partial or early small bowel obstruction. Electronically Signed   By: Fredrich Jefferson M.D.   On: 08/18/2023 08:47   DG Abd Portable 1V Result Date: 08/16/2023 CLINICAL DATA:  Enteric catheter placement EXAM: PORTABLE ABDOMEN - 1 VIEW COMPARISON:  08/13/2023 FINDINGS: Frontal view of the lower chest and upper abdomen demonstrates enteric catheter passing below diaphragm, tip  overlying the gastric fundus. Right internal jugular catheter tip overlies atriocaval junction. Lung bases are clear. Nonspecific gaseous distention of the bowel is noted. IMPRESSION: 1. Enteric catheter tip projecting over gastric fundus. Electronically Signed   By: Bobbye Burrow M.D.   On: 08/16/2023 13:14   DG CHEST PORT 1 VIEW Result Date: 08/16/2023 CLINICAL DATA:  [id number] Encounter for nasogastric (NG) tube placement [id number] EXAM: PORTABLE CHEST 1 VIEW COMPARISON:  August 14, 2023 FINDINGS: Incomplete visualization of the abdomen. Enteric tube is coiled over the distal esophagus with tip coursing superiorly towards the neck beyond the field of view. RIGHT IJ CVC tip terminates over the inferior SVC. Gaseous distension of visualized bowel loops. IMPRESSION: Enteric tube is coiled over the distal esophagus with tip coursing superiorly towards the neck beyond the field of view. Recommend repositioning. Electronically Signed   By: Clancy Crimes M.D.   On: 08/16/2023 12:15    Anti-infectives: Anti-infectives (From admission, onward)    Start     Dose/Rate Route Frequency Ordered Stop   08/14/23 0100  cefTRIAXone  (ROCEPHIN ) 2 g in sodium chloride  0.9 % 100 mL IVPB        2 g 200 mL/hr over 30 Minutes Intravenous Every 24 hours 08/14/23 0000     08/14/23 0100  doxycycline  (VIBRAMYCIN ) 100 mg in sodium chloride  0.9 % 250 mL IVPB        100 mg 125 mL/hr over 120 Minutes Intravenous Every 12 hours 08/14/23  0000         Assessment/Plan: Patient with SBO and AKI. Getting dialysis today. Plan for SBFT if not resolving by tomorrow. Plan for OR Wednesday if SBFT is positive for SBO.  He is getting HD today. Discussed with Dr. Quintella Buck and patient.  NPO NG Dulcolax suppository for now OOB   LOS: 5 days    Awilda Bogus 08/18/2023

## 2023-08-18 NOTE — Progress Notes (Signed)
 PROGRESS NOTE  Joseph Conrad, is a 68 y.o. male, DOB - Jan 16, 1956, ZHY:[id number]  Admit date - 08/13/2023   Admitting Physician Joseph Debroah Fanning, MD  Outpatient Primary MD for the patient is [facility]  LOS - 5  CC---no urine     Brief Narrative:  68 y.o. male with medical history significant for CKD IV, diabetes mellitus, hypertension, OSA admitted on 08/13/2023 with AKI on CKD in the setting of SBO   -Assessment and Plan: 1)SBO (small bowel obstruction) --POA -- Recommends continue NG tube and conservative approach for now - Patient denies prior surgeries.  Colonoscopy 2021 - diverticulosis, 1 polyp and internal hemorrhoids. -Gen Surgery consult from Dr. Collene Dawson appreciated--- 08/18/23 -Had BM after suppository - Continue IV Dextrose  fluid to avoid Hypoglycemia while awaiting return of bowel function and tolerance of oral intake - Plan is for small bowel follow-through on 08/19/2023 and possible surgery if No resolution of SBO on 08/20/2023  2)Persistent Hypoglycemia--- due to #1 above -Hold PTA insulin  - Blood glucose was down to the 50s---Now stable  with iv Dextrose  - Continuous IV dextrose  drip for now while NPO as above in # 1  3)AKI----acute kidney injury on CKD stage - IV--in the setting of dehydration with volume depletion and hypotension due to #1, compounded by losartan use PTA -  creatinine on admission= 12.37 - baseline creatinine =  3.1 (05/03/23)  ,  creatinine  13.40 ([place]) , -Hold PTA losartan - renally adjust medications, avoid nephrotoxic agents / dehydration  / hypotension - Status post right IJ temporary catheter placement on 08/14/2023   - Nephrology input appreciated -Initiated HD on 08/15/23 (then had HD on 08/16/23 and 08/18/23) 08/18/23 - Having urine output - Plan for third hemodialysis session on 08/18/2023 - Defer to nephrology team as to whether patient will need further hemodialysis after 08/18/2023  4)Aspiration  Pneumonia/CAP---POA - Suspect due to #1 above with persistent emesis - Continue Rocephin /Doxy - prn bronchodilators  5)HTN--was hypotensive due to volume depletion in setting of # 1 above - continue to Hold Carvedilol , Losartan and Norvasc 5 mg  6)Hypocalcemia--when corrected for low albumin he has mild hypocalcemia --Replace and recheck  7)Hyperphosphatemia ---in the setting of # 1 above  Status is: Inpatient   Disposition: The patient is from: Home              Anticipated d/c is to: Home              Anticipated d/c date is: > 3 days              Patient currently is not medically stable to d/c. Barriers: Not Clinically Stable-   Code Status :  -  Code Status: Full Code   Family Communication:    (patient is alert, awake and coherent)  Discussed with S/o DEE at bedside  DVT Prophylaxis  :   - SCDs   SCDs Start: 08/13/23 2359  Lab Results  Component Value Date   PLT 156 08/18/2023   Inpatient Medications  Scheduled Meds:  bisacodyl  10 mg Rectal BID   Chlorhexidine  Gluconate Cloth  6 each Topical Daily   Chlorhexidine  Gluconate Cloth  6 each Topical Q0600   Continuous Infusions:  cefTRIAXone  (ROCEPHIN )  IV 2 g (08/18/23 0036)   dextrose  5 % and 0.45 % NaCl 75 mL/hr at 08/18/23 0428   doxycycline  (VIBRAMYCIN ) IV 100 mg (08/18/23 0038)   PRN Meds:.acetaminophen  **OR** acetaminophen , albuterol , HYDROmorphone  (DILAUDID ) injection, phenol, promethazine   Anti-infectives (From admission, onward)    Start     Dose/Rate Route Frequency Ordered Stop   08/14/23 0100  cefTRIAXone  (ROCEPHIN ) 2 g in sodium chloride  0.9 % 100 mL IVPB        2 g 200 mL/hr over 30 Minutes Intravenous Every 24 hours 08/14/23 0000     08/14/23 0100  doxycycline  (VIBRAMYCIN ) 100 mg in sodium chloride  0.9 % 250 mL IVPB        100 mg 125 mL/hr over 120 Minutes Intravenous Every 12 hours 08/14/23 0000        Subjective: Joseph Conrad today has no fevers, No chest pain,    - Had BM with  Dulcolax suppository - Minimal flatus - Trying to ambulate - Plans for third HD session today (08/18/23)  Objective: Vitals:   08/18/23 0700 08/18/23 0738 08/18/23 0800 08/18/23 0900  BP: (!) 151/65  126/76 116/73  Pulse: 96 92 83 95  Resp: 12 15 17 15   Temp:  98.2 F (36.8 C)    TempSrc:  Oral    SpO2: 96% 96% 95% 95%  Weight:      Height:        Intake/Output Summary (Last 24 hours) at 08/18/2023 1009 Last data filed at 08/18/2023 0634 Gross per 24 hour  Intake 948.44 ml  Output 1800 ml  Net -851.56 ml   Filed Weights   08/16/23 1442 08/16/23 1713 08/17/23 0458  Weight: 111 kg 111.2 kg 111.2 kg   Physical Exam Gen:- Awake Alert,  in no apparent distress  HEENT:- .AT, No sclera icterus Nose-NG tube in situ Neck-Supple Neck, right IJ HD catheter Lungs-  CTAB , fair symmetrical air movement CV- S1, S2 normal, regular  Abd-  Abd Soft, improving abdominal distention, Faint bowel sounds Extremity/Skin:- No  edema, pedal pulses present  Psych-affect is appropriate, oriented x3 Neuro-no new focal deficits, no tremors  Data Reviewed: I have personally reviewed following labs and imaging studies  CBC: Recent Labs  Lab 08/13/23 1858 08/14/23 0442 08/18/23 0424  WBC 7.2 7.7 7.9  NEUTROABS 4.7  --   --   HGB 15.1 13.6 12.5*  HCT 45.9 41.9 37.9*  MCV 85.8 83.8 86.5  PLT 255 222 156   Basic Metabolic Panel: Recent Labs  Lab 08/14/23 0442 08/15/23 0419 08/16/23 0255 08/17/23 0211 08/17/23 1553 08/18/23 0424  NA 138 139 136 137  --  142  K 3.7 3.5 3.4* 3.3* 4.2 3.9  CL 89* 90* 93* 95*  --  102  CO2 26 26 26 26   --  26  GLUCOSE 144* 152* 142* 130*  --  143*  BUN 116* 133* 93* 75*  --  84*  CREATININE 12.85* 13.40* 9.84* 7.40*  --  7.40*  CALCIUM  6.9* 6.4* 7.1* 7.4*  --  8.5*  MG 2.5*  --   --  2.1  --   --   PHOS  --  7.3* 5.7*  --   --  6.1*   GFR: Estimated Creatinine Clearance: 11.9 mL/min (A) (by C-G formula based on SCr of 7.4 mg/dL (H)). Liver  Function Tests: Recent Labs  Lab 08/13/23 1858 08/15/23 0419 08/16/23 0255 08/18/23 0424  AST 18  --   --   --   ALT 25  --   --   --   ALKPHOS 61  --   --   --   BILITOT 0.7  --   --   --   PROT 7.8  --   --   --  ALBUMIN 3.5 2.5* 2.6* 2.5*    Recent Results (from the past 240 hours)  Blood culture (routine x 2)     Status: None   Collection Time: 08/13/23  6:58 PM   Specimen: BLOOD  Result Value Ref Range Status   Specimen Description BLOOD RIGHT ANTECUBITAL  Final   Special Requests   Final    BOTTLES DRAWN AEROBIC AND ANAEROBIC Blood Culture adequate volume   Culture   Final    NO GROWTH 5 DAYS Performed at [facility], [address] [place], [place] [address]    Report Status 08/18/2023 FINAL  Final  Blood culture (routine x 2)     Status: None   Collection Time: 08/13/23  6:58 PM   Specimen: BLOOD  Result Value Ref Range Status   Specimen Description BLOOD LEFT ANTECUBITAL  Final   Special Requests   Final    BOTTLES DRAWN AEROBIC ONLY Blood Culture results may not be optimal due to an inadequate volume of blood received in culture bottles   Culture   Final    NO GROWTH 5 DAYS Performed at [facility], [address], [place], [place] [address]    Report Status 08/18/2023 FINAL  Final  MRSA Next Gen by PCR, Nasal     Status: None   Collection Time: 08/13/23 11:36 PM   Specimen: Nasal Mucosa; Nasal Swab  Result Value Ref Range Status   MRSA by PCR Next Gen NOT DETECTED NOT DETECTED Final    Comment: (NOTE) The GeneXpert MRSA Assay (FDA approved for NASAL specimens only), is one component of a comprehensive MRSA colonization surveillance program. It is not intended to diagnose MRSA infection nor to guide or monitor treatment for MRSA infections. Test performance is not FDA approved in patients less than 18 years old. Performed at [facility], [address], [place], [place] [address]     Radiology Studies: DG Abd 1 View Result Date: 08/18/2023 CLINICAL  DATA:  Bowel obstruction EXAM: ABDOMEN - 1 VIEW COMPARISON:  August 16, 2023 FINDINGS: Multiple persistently dilated bowel loops in the central portion of the abdomen could correlate with partial or early small bowel obstruction. IMPRESSION: Multiple persistently dilated bowel loops in the central portion of the abdomen could correlate with partial or early small bowel obstruction. Electronically Signed   By: Fredrich Jefferson M.D.   On: 08/18/2023 08:47   DG Abd Portable 1V Result Date: 08/16/2023 CLINICAL DATA:  Enteric catheter placement EXAM: PORTABLE ABDOMEN - 1 VIEW COMPARISON:  08/13/2023 FINDINGS: Frontal view of the lower chest and upper abdomen demonstrates enteric catheter passing below diaphragm, tip overlying the gastric fundus. Right internal jugular catheter tip overlies atriocaval junction. Lung bases are clear. Nonspecific gaseous distention of the bowel is noted. IMPRESSION: 1. Enteric catheter tip projecting over gastric fundus. Electronically Signed   By: Bobbye Burrow M.D.   On: 08/16/2023 13:14   DG CHEST PORT 1 VIEW Result Date: 08/16/2023 CLINICAL DATA:  [id number] Encounter for nasogastric (NG) tube placement [id number] EXAM: PORTABLE CHEST 1 VIEW COMPARISON:  August 14, 2023 FINDINGS: Incomplete visualization of the abdomen. Enteric tube is coiled over the distal esophagus with tip coursing superiorly towards the neck beyond the field of view. RIGHT IJ CVC tip terminates over the inferior SVC. Gaseous distension of visualized bowel loops. IMPRESSION: Enteric tube is coiled over the distal esophagus with tip coursing superiorly towards the neck beyond the field of view. Recommend repositioning. Electronically Signed   By: Clancy Crimes M.D.  On: 08/16/2023 12:15   Scheduled Meds:  bisacodyl  10 mg Rectal BID   Chlorhexidine  Gluconate Cloth  6 each Topical Daily   Chlorhexidine  Gluconate Cloth  6 each Topical Q0600   Continuous Infusions:  cefTRIAXone  (ROCEPHIN )  IV 2 g (08/18/23 0036)    dextrose  5 % and 0.45 % NaCl 75 mL/hr at 08/18/23 0428   doxycycline  (VIBRAMYCIN ) IV 100 mg (08/18/23 0038)    LOS: 5 days   Colin Dawley M.D on 08/18/2023 at 10:09 AM  Go to [email or web address] - for contact info  [facility] - Office  [phone]  If 7PM-7AM, please contact night-coverage [email or web address] 08/18/2023, 10:09 AM

## 2023-08-18 NOTE — Procedures (Signed)
   Nephrology Nursing Note:  HD treatment deferred to tomorrow, 08/19/23, per Dr. Alexandra Ice. [name].    [name], RN

## 2023-08-18 NOTE — Plan of Care (Signed)

## 2023-08-18 NOTE — Progress Notes (Signed)
 [facility] Progress Note   Assessment/ Plan:   1. Acute kidney Injury on chronic kidney disease stage IV: Suspected likely secondary to ATN in the setting of small bowel obstruction with third spacing/volume depletion/hypotension and concomitant therapy.  Started on hemodialysis for progressive azotemia.  Urine output improved overnight at 1.1 L with essentially flat creatinine and slight rise of BUN.  Will undertake dialysis today primarily for management of azotemia without any ultrafiltration (patient is euvolemic on physical exam).  Anticipate this may be the last dialysis treatment that he will need with the encouraging trajectory of his urine output. 2.  Small bowel obstruction: Reports bowel movement/flatus yesterday.  No surgical indication noted with ongoing conservative management of bowel rest/NGT. 3.  Hypertension: Blood pressure noted to be marginally elevated, continue to monitor with intravenous fluids and off of antihypertensive therapy. 4.  Aspiration pneumonia: On antibiotic therapy ceftriaxone  and doxycycline .  Subjective:   Reports to be feeling fair with no significant abdominal pain or discomfort.  Denies chest pain.  Breath   Objective:   BP (!) 151/65   Pulse 96   Temp 98.2 F (36.8 C) (Oral)   Resp 12   Ht 5' 8 (1.727 m)   Wt 111.2 kg   SpO2 96%   BMI 37.28 kg/m   Intake/Output Summary (Last 24 hours) at 08/18/2023 0746 Last data filed at 08/18/2023 0634 Gross per 24 hour  Intake 1068.44 ml  Output 1800 ml  Net -731.56 ml   Weight change:   Physical Exam: Gen: Appears comfortable resting in bed.  NGT in place CVS: Regular rhythm/normal rate, normal S1 and S2 Resp: Decreased breath sounds at the bases, clear to auscultation anteriorly no rales/rhonchi Abd: Soft, moderately distended, bowel sounds audible Ext: No lower extremity edema  Imaging: DG Abd Portable 1V Result Date: 08/16/2023 CLINICAL DATA:  Enteric catheter placement EXAM:  PORTABLE ABDOMEN - 1 VIEW COMPARISON:  08/13/2023 FINDINGS: Frontal view of the lower chest and upper abdomen demonstrates enteric catheter passing below diaphragm, tip overlying the gastric fundus. Right internal jugular catheter tip overlies atriocaval junction. Lung bases are clear. Nonspecific gaseous distention of the bowel is noted. IMPRESSION: 1. Enteric catheter tip projecting over gastric fundus. Electronically Signed   By: Bobbye Burrow M.D.   On: 08/16/2023 13:14   DG CHEST PORT 1 VIEW Result Date: 08/16/2023 CLINICAL DATA:  [id number] Encounter for nasogastric (NG) tube placement [id number] EXAM: PORTABLE CHEST 1 VIEW COMPARISON:  August 14, 2023 FINDINGS: Incomplete visualization of the abdomen. Enteric tube is coiled over the distal esophagus with tip coursing superiorly towards the neck beyond the field of view. RIGHT IJ CVC tip terminates over the inferior SVC. Gaseous distension of visualized bowel loops. IMPRESSION: Enteric tube is coiled over the distal esophagus with tip coursing superiorly towards the neck beyond the field of view. Recommend repositioning. Electronically Signed   By: Clancy Crimes M.D.   On: 08/16/2023 12:15    Labs: BMET Recent Labs  Lab 08/13/23 1858 08/14/23 0442 08/15/23 0419 08/16/23 0255 08/17/23 0211 08/17/23 1553 08/18/23 0424  NA 142 138 139 136 137  --  142  K 4.1 3.7 3.5 3.4* 3.3* 4.2 3.9  CL 88* 89* 90* 93* 95*  --  102  CO2 24 26 26 26 26   --  26  GLUCOSE 66* 144* 152* 142* 130*  --  143*  BUN 96* 116* 133* 93* 75*  --  84*  CREATININE 12.37* 12.85* 13.40* 9.84* 7.40*  --  7.40*  CALCIUM  7.8* 6.9* 6.4* 7.1* 7.4*  --  8.5*  PHOS  --   --  7.3* 5.7*  --   --  6.1*   CBC Recent Labs  Lab 08/13/23 1858 08/14/23 0442 08/18/23 0424  WBC 7.2 7.7 7.9  NEUTROABS 4.7  --   --   HGB 15.1 13.6 12.5*  HCT 45.9 41.9 37.9*  MCV 85.8 83.8 86.5  PLT 255 222 156    Medications:     bisacodyl  10 mg Rectal BID   Chlorhexidine  Gluconate Cloth  6  each Topical Daily   Chlorhexidine  Gluconate Cloth  6 each Topical Q0600    Clevester Dally, MD 08/18/2023, 7:46 AM

## 2023-08-19 ENCOUNTER — Inpatient Hospital Stay (HOSPITAL_COMMUNITY)

## 2023-08-19 DIAGNOSIS — K56609 Unspecified intestinal obstruction, unspecified as to partial versus complete obstruction: Secondary | ICD-10-CM | POA: Diagnosis not present

## 2023-08-19 LAB — GLUCOSE, CAPILLARY
Glucose-Capillary: 143 mg/dL — ABNORMAL HIGH (ref 70–99)
Glucose-Capillary: 145 mg/dL — ABNORMAL HIGH (ref 70–99)
Glucose-Capillary: 156 mg/dL — ABNORMAL HIGH (ref 70–99)
Glucose-Capillary: 161 mg/dL — ABNORMAL HIGH (ref 70–99)
Glucose-Capillary: 166 mg/dL — ABNORMAL HIGH (ref 70–99)
Glucose-Capillary: 174 mg/dL — ABNORMAL HIGH (ref 70–99)

## 2023-08-19 LAB — CBC
HCT: 39 % (ref 39.0–52.0)
Hemoglobin: 12.6 g/dL — ABNORMAL LOW (ref 13.0–17.0)
MCH: 27.6 pg (ref 26.0–34.0)
MCHC: 32.3 g/dL (ref 30.0–36.0)
MCV: 85.5 fL (ref 80.0–100.0)
Platelets: 143 10*3/uL — ABNORMAL LOW (ref 150–400)
RBC: 4.56 MIL/uL (ref 4.22–5.81)
RDW: 13.6 % (ref 11.5–15.5)
WBC: 9.5 10*3/uL (ref 4.0–10.5)
nRBC: 0 % (ref 0.0–0.2)

## 2023-08-19 LAB — RENAL FUNCTION PANEL
Albumin: 2.6 g/dL — ABNORMAL LOW (ref 3.5–5.0)
Anion gap: 15 (ref 5–15)
BUN: 83 mg/dL — ABNORMAL HIGH (ref 8–23)
CO2: 24 mmol/L (ref 22–32)
Calcium: 8.6 mg/dL — ABNORMAL LOW (ref 8.9–10.3)
Chloride: 102 mmol/L (ref 98–111)
Creatinine, Ser: 6.67 mg/dL — ABNORMAL HIGH (ref 0.61–1.24)
GFR, Estimated: 9 mL/min — ABNORMAL LOW (ref >60)
Glucose, Bld: 161 mg/dL — ABNORMAL HIGH (ref 70–99)
Phosphorus: 5.5 mg/dL — ABNORMAL HIGH (ref 2.5–4.6)
Potassium: 4.2 mmol/L (ref 3.5–5.1)
Sodium: 141 mmol/L (ref 135–145)

## 2023-08-19 MED ORDER — PROMETHAZINE HCL 12.5 MG PO TABS: 12.5000 mg | ORAL_TABLET | Freq: Four times a day (QID) | ORAL | Status: DC | PRN

## 2023-08-19 MED ORDER — PROMETHAZINE HCL 12.5 MG RE SUPP: 12.5000 mg | Freq: Four times a day (QID) | RECTAL | Status: DC | PRN

## 2023-08-19 MED ORDER — CHLORHEXIDINE GLUCONATE CLOTH 2 % EX PADS
6.0000 | MEDICATED_PAD | Freq: Once | CUTANEOUS | Status: AC
  Administered 2023-08-19: 6 via TOPICAL

## 2023-08-19 MED ORDER — SODIUM CHLORIDE 0.9 % IV SOLN
2.0000 g | INTRAVENOUS | Status: AC
  Filled 2023-08-19: qty 2

## 2023-08-19 MED ORDER — DEXTROSE-SODIUM CHLORIDE 5-0.45 % IV SOLN: INTRAVENOUS | Status: AC

## 2023-08-19 MED ORDER — DIATRIZOATE MEGLUMINE & SODIUM 66-10 % PO SOLN
90.0000 mL | Freq: Once | ORAL | Status: AC
  Administered 2023-08-19: 90 mL via NASOGASTRIC
  Filled 2023-08-19: qty 90

## 2023-08-19 MED ORDER — SODIUM CHLORIDE 0.9 % IV SOLN
12.5000 mg | Freq: Four times a day (QID) | INTRAVENOUS | Status: DC | PRN
  Filled 2023-08-19: qty 0.5

## 2023-08-19 NOTE — Progress Notes (Signed)
 [facility] Progress Note     Subjective: No BM or flatus. Did not do dialysis yesterday. UOP improving and labs improving per nephrology.   Objective: Vital signs in last 24 hours: Temp:  [97.5 F (36.4 C)-98.6 F (37 C)] 98.1 F (36.7 C) (06/17 0748) Pulse Rate:  [88-114] 88 (06/17 0800) Resp:  [12-24] 15 (06/17 0800) BP: (113-165)/(66-107) 121/73 (06/17 0800) SpO2:  [91 %-99 %] 93 % (06/17 0800) Last BM Date : 08/17/23  Intake/Output from previous day: 06/16 0701 - 06/17 0700 In: 3606.5 [I.V.:2655.7; IV Piggyback:950.7] Out: 2375 [Urine:1425; Emesis/NG output:950] Intake/Output this shift: No intake/output data recorded.  General appearance: alert and no distress GI: soft, nondistended, nontender   Lab Results:  Recent Labs    08/18/23 0424 08/19/23 0853  WBC 7.9 9.5  HGB 12.5* 12.6*  HCT 37.9* 39.0  PLT 156 143*   BMET Recent Labs    08/18/23 0424 08/19/23 0853  NA 142 141  K 3.9 4.2  CL 102 102  CO2 26 24  GLUCOSE 143* 161*  BUN 84* 83*  CREATININE 7.40* 6.67*  CALCIUM  8.5* 8.6*   PT/INR No results for input(s): LABPROT, INR in the last 72 hours.  Studies/Results: DG Abd 1 View Result Date: 08/18/2023 CLINICAL DATA:  Bowel obstruction EXAM: ABDOMEN - 1 VIEW COMPARISON:  August 16, 2023 FINDINGS: Multiple persistently dilated bowel loops in the central portion of the abdomen could correlate with partial or early small bowel obstruction. IMPRESSION: Multiple persistently dilated bowel loops in the central portion of the abdomen could correlate with partial or early small bowel obstruction. Electronically Signed   By: Fredrich Jefferson M.D.   On: 08/18/2023 08:47    Anti-infectives: Anti-infectives (From admission, onward)    Start     Dose/Rate Route Frequency Ordered Stop   08/14/23 0100  cefTRIAXone  (ROCEPHIN ) 2 g in sodium chloride  0.9 % 100 mL IVPB        2 g 200 mL/hr over 30 Minutes Intravenous Every 24 hours 08/14/23 0000  08/21/23 0059   08/14/23 0100  doxycycline  (VIBRAMYCIN ) 100 mg in sodium chloride  0.9 % 250 mL IVPB        100 mg 125 mL/hr over 120 Minutes Intravenous Every 12 hours 08/14/23 0000 08/21/23 0059       Assessment/Plan: Patient with SBO. SBFT today. Discussed potential need for surgery tomorrow if SBFT shows continue obstruction. Discussed risk of bleeding, infection, risk of needing resection, anastomotic leak.   NPO, NG SBFT today  Update team.    LOS: 6 days    Awilda Bogus 08/19/2023

## 2023-08-19 NOTE — Progress Notes (Signed)
 [facility]  KUB in the a.m.    Small follow-through with less dilated, small bowel and contrast in the colon.  We'll see how the a.m. x-ray looks already consented for surgery pending no improvement.  Deena Farrier MD

## 2023-08-19 NOTE — Progress Notes (Addendum)
 [facility] Progress Note   Assessment/ Plan:   1. Acute kidney Injury on chronic kidney disease stage IV: Suspected likely secondary to ATN in the setting of small bowel obstruction with third spacing/volume depletion/hypotension and concomitant therapy.  Started on hemodialysis for progressive azotemia.  Urine output improved overnight from  1.1 L -> 1.4L.  Blood draw has not been done yet but patient is asymptomatic with no uremic symptoms or shortness of breath and will continue holding dialysis until there is an absolute indication.  Will reevaluate tomorrow a.m. again.    2.  Small bowel obstruction: Reports bowel movement/flatus .  No surgical indication noted with ongoing conservative management of bowel rest/NGT. Hungry as well. 3.  Hypertension: Blood pressure controlled currently on maintenance fluids and still off of antihypertensive therapy. 4.  Aspiration pneumonia: On antibiotic therapy ceftriaxone  and doxycycline .  Subjective:   Reports to be feeling better with no significant abdominal pain or discomfort; hungry, passing flatus overnight.  Denies chest pain or sob   Objective:   BP (!) 126/92   Pulse 95   Temp 98.1 F (36.7 C) (Axillary)   Resp 17   Ht 5' 8 (1.727 m)   Wt 111.2 kg   SpO2 95%   BMI 37.28 kg/m   Intake/Output Summary (Last 24 hours) at 08/19/2023 0817 Last data filed at 08/19/2023 0454 Gross per 24 hour  Intake 3606.45 ml  Output 2375 ml  Net 1231.45 ml   Weight change:   Physical Exam: Gen: Appears comfortable resting in bed.  NGT in place CVS: Regular rhythm/normal rate, normal S1 and S2 Resp: Decreased breath sounds at the bases, clear to auscultation anteriorly no rales/rhonchi Abd: Soft, moderately distended, bowel sounds audible Ext: No lower extremity edema Access: RIJ temp  Imaging: DG Abd 1 View Result Date: 08/18/2023 CLINICAL DATA:  Bowel obstruction EXAM: ABDOMEN - 1 VIEW COMPARISON:  August 16, 2023 FINDINGS:  Multiple persistently dilated bowel loops in the central portion of the abdomen could correlate with partial or early small bowel obstruction. IMPRESSION: Multiple persistently dilated bowel loops in the central portion of the abdomen could correlate with partial or early small bowel obstruction. Electronically Signed   By: Fredrich Jefferson M.D.   On: 08/18/2023 08:47    Labs: BMET Recent Labs  Lab 08/13/23 1858 08/14/23 0442 08/15/23 0419 08/16/23 0255 08/17/23 0211 08/17/23 1553 08/18/23 0424  NA 142 138 139 136 137  --  142  K 4.1 3.7 3.5 3.4* 3.3* 4.2 3.9  CL 88* 89* 90* 93* 95*  --  102  CO2 24 26 26 26 26   --  26  GLUCOSE 66* 144* 152* 142* 130*  --  143*  BUN 96* 116* 133* 93* 75*  --  84*  CREATININE 12.37* 12.85* 13.40* 9.84* 7.40*  --  7.40*  CALCIUM  7.8* 6.9* 6.4* 7.1* 7.4*  --  8.5*  PHOS  --   --  7.3* 5.7*  --   --  6.1*   CBC Recent Labs  Lab 08/13/23 1858 08/14/23 0442 08/18/23 0424  WBC 7.2 7.7 7.9  NEUTROABS 4.7  --   --   HGB 15.1 13.6 12.5*  HCT 45.9 41.9 37.9*  MCV 85.8 83.8 86.5  PLT 255 222 156    Medications:     bisacodyl  10 mg Rectal BID   Chlorhexidine  Gluconate Cloth  6 each Topical Daily   Chlorhexidine  Gluconate Cloth  6 each Topical Q0600   diatrizoate meglumine-sodium  90 mL Per NG tube Once

## 2023-08-19 NOTE — Plan of Care (Signed)
  Problem: Education: Goal: Knowledge of General Education information will improve Description: Including pain rating scale, medication(s)/side effects and non-pharmacologic comfort measures Outcome: Progressing   Problem: Health Behavior/Discharge Planning: Goal: Ability to manage health-related needs will improve Outcome: Progressing   Problem: Clinical Measurements: Goal: Ability to maintain clinical measurements within normal limits will improve Outcome: Progressing Goal: Will remain free from infection Outcome: Progressing Goal: Diagnostic test results will improve Outcome: Progressing Goal: Respiratory complications will improve Outcome: Progressing Goal: Cardiovascular complication will be avoided Outcome: Progressing   Problem: Activity: Goal: Risk for activity intolerance will decrease Outcome: Progressing   Problem: Nutrition: Goal: Adequate nutrition will be maintained Outcome: Not Progressing   Problem: Coping: Goal: Level of anxiety will decrease Outcome: Progressing   Problem: Elimination: Goal: Will not experience complications related to bowel motility Outcome: Progressing Goal: Will not experience complications related to urinary retention Outcome: Progressing   Problem: Pain Managment: Goal: General experience of comfort will improve and/or be controlled Outcome: Progressing   Problem: Safety: Goal: Ability to remain free from injury will improve Outcome: Progressing   Problem: Skin Integrity: Goal: Risk for impaired skin integrity will decrease Outcome: Progressing

## 2023-08-19 NOTE — Progress Notes (Signed)
 PROGRESS NOTE  Joseph Conrad, is a 68 y.o. adult, DOB - [birth year], [id number]  Admit date - 08/13/2023   Admitting Physician Ejiroghene Debroah Fanning, MD  Outpatient Primary MD for the patient is [facility], [facility]  LOS - 6  CC---no urine     Brief Narrative:  68 y.o. male with medical history significant for CKD IV, diabetes mellitus, hypertension, OSA admitted on 08/13/2023 with AKI on CKD in the setting of SBO   -Assessment and Plan: 1)SBO (small bowel obstruction) --POA -- Recommends continue NG tube and conservative approach for now - Patient denies prior surgeries.  Colonoscopy 2021 - diverticulosis, 1 polyp and internal hemorrhoids. -Gen Surgery consult from Dr. Collene Dawson appreciated--- 08/19/23 - Continue IV Dextrose  fluid to avoid Hypoglycemia while awaiting return of bowel function and tolerance of oral intake -Tolerating oral Gastrografin contrast well in preparation for small bowel follow-through on 08/19/2023  -possible surgery if No resolution of SBO on 08/20/2023  2)Persistent Hypoglycemia--- due to #1 above -Hold PTA insulin  - Blood glucose was down to the 50s---Now stable  with iv Dextrose  - Continuous IV dextrose  drip for now while NPO as above in # 1  3)AKI----acute kidney injury on CKD stage - IV--in the setting of dehydration with volume depletion and hypotension due to #1, compounded by losartan use PTA -  creatinine on admission= 12.37 - baseline creatinine =  3.1 (05/03/23)  ,  creatinine  13.40 ([facility]) , -Hold PTA losartan - renally adjust medications, avoid nephrotoxic agents / dehydration  / hypotension - Status post right IJ temporary catheter placement on 08/14/2023   - Nephrology input appreciated -Initiated HD on 08/15/23 (then had HD on 08/16/23 and 08/18/23) 08/19/23 - Having urine output -Per nephrologist Dr. Austine Blunt okay to hold off on further hemodialysis at this time, hoping that patient will Not need additional hemodialysis going  forward --  4)Aspiration Pneumonia/CAP---POA - Suspect due to #1 above with persistent emesis - Continue Rocephin /Doxy - prn bronchodilators  5)HTN--was hypotensive due to volume depletion in setting of # 1 above - continue to Hold Carvedilol , Losartan and Norvasc 5 mg  6)Hypocalcemia--when corrected for low albumin he has mild hypocalcemia --Replaced  7)Hyperphosphatemia ---in the setting of # 1 above  Status is: Inpatient   Disposition: The patient is from: Home              Anticipated d/c is to: Home              Anticipated d/c date is: > 3 days              Patient currently is not medically stable to d/c. Barriers: Not Clinically Stable-   Code Status :  -  Code Status: Full Code   Family Communication:    (patient is alert, awake and coherent)  Discussed with S/o DEE at bedside  DVT Prophylaxis  :   - SCDs   SCDs Start: 08/13/23 2359  Lab Results  Component Value Date   PLT 143 (L) 08/19/2023   Inpatient Medications  Scheduled Meds:  bisacodyl  10 mg Rectal BID   Chlorhexidine  Gluconate Cloth  6 each Topical Daily   Chlorhexidine  Gluconate Cloth  6 each Topical Q0600   Continuous Infusions:  cefTRIAXone  (ROCEPHIN )  IV Stopped (08/19/23 0036)   dextrose  5 % and 0.45 % NaCl     doxycycline  (VIBRAMYCIN ) IV Stopped (08/19/23 0318)   PRN Meds:.acetaminophen  **OR** acetaminophen , albuterol , HYDROmorphone  (DILAUDID ) injection, phenol, promethazine    Anti-infectives (From admission, onward)  Start     Dose/Rate Route Frequency Ordered Stop   08/14/23 0100  cefTRIAXone  (ROCEPHIN ) 2 g in sodium chloride  0.9 % 100 mL IVPB        2 g 200 mL/hr over 30 Minutes Intravenous Every 24 hours 08/14/23 0000 08/21/23 0059   08/14/23 0100  doxycycline  (VIBRAMYCIN ) 100 mg in sodium chloride  0.9 % 250 mL IVPB        100 mg 125 mL/hr over 120 Minutes Intravenous Every 12 hours 08/14/23 0000 08/21/23 0059      Subjective: [name] today has no fevers, No chest  pain,    - Tolerating oral Gastrografin well - Abdominal pain is not worse  Objective: Vitals:   08/19/23 0600 08/19/23 0700 08/19/23 0748 08/19/23 0800  BP: 118/71 (!) 126/92  121/73  Pulse: 93 95  88  Resp: 19 17  15   Temp:   98.1 F (36.7 C)   TempSrc:   Axillary   SpO2: 93% 95%  93%  Weight:      Height:        Intake/Output Summary (Last 24 hours) at 08/19/2023 1012 Last data filed at 08/19/2023 0454 Gross per 24 hour  Intake 3606.45 ml  Output 2375 ml  Net 1231.45 ml   Filed Weights   08/16/23 1442 08/16/23 1713 08/17/23 0458  Weight: 111 kg 111.2 kg 111.2 kg   Physical Exam Gen:- Awake Alert,  in no apparent distress  HEENT:- [place].AT, No sclera icterus Nose-NG tube in situ Neck-Supple Neck, right IJ HD catheter Lungs-  CTAB , fair symmetrical air movement CV- S1, S2 normal, regular  Abd-  Abd Soft, improving abdominal distention, Faint bowel sounds Extremity/Skin:- No  edema, pedal pulses present  Psych-affect is appropriate, oriented x3 Neuro-no new focal deficits, no tremors  Data Reviewed: I have personally reviewed following labs and imaging studies  CBC: Recent Labs  Lab 08/13/23 1858 08/14/23 0442 08/18/23 0424 08/19/23 0853  WBC 7.2 7.7 7.9 9.5  NEUTROABS 4.7  --   --   --   HGB 15.1 13.6 12.5* 12.6*  HCT 45.9 41.9 37.9* 39.0  MCV 85.8 83.8 86.5 85.5  PLT 255 222 156 143*   Basic Metabolic Panel: Recent Labs  Lab 08/14/23 0442 08/15/23 0419 08/16/23 0255 08/17/23 0211 08/17/23 1553 08/18/23 0424 08/19/23 0853  NA 138 139 136 137  --  142 141  K 3.7 3.5 3.4* 3.3* 4.2 3.9 4.2  CL 89* 90* 93* 95*  --  102 102  CO2 26 26 26 26   --  26 24  GLUCOSE 144* 152* 142* 130*  --  143* 161*  BUN 116* 133* 93* 75*  --  84* 83*  CREATININE 12.85* 13.40* 9.84* 7.40*  --  7.40* 6.67*  CALCIUM  6.9* 6.4* 7.1* 7.4*  --  8.5* 8.6*  MG 2.5*  --   --  2.1  --   --   --   PHOS  --  7.3* 5.7*  --   --  6.1* 5.5*   GFR: Estimated Creatinine Clearance (by  C-G formula based on SCr of 6.67 mg/dL (H)) Male: 65.7 mL/min (A) Male: 13.2 mL/min (A) Liver Function Tests: Recent Labs  Lab 08/13/23 1858 08/15/23 0419 08/16/23 0255 08/18/23 0424 08/19/23 0853  AST 18  --   --   --   --   ALT 25  --   --   --   --   ALKPHOS 61  --   --   --   --  BILITOT 0.7  --   --   --   --   PROT 7.8  --   --   --   --   ALBUMIN 3.5 2.5* 2.6* 2.5* 2.6*    Recent Results (from the past 240 hours)  Blood culture (routine x 2)     Status: None   Collection Time: 08/13/23  6:58 PM   Specimen: BLOOD  Result Value Ref Range Status   Specimen Description BLOOD RIGHT ANTECUBITAL  Final   Special Requests   Final    BOTTLES DRAWN AEROBIC AND ANAEROBIC Blood Culture adequate volume   Culture   Final    NO GROWTH 5 DAYS Performed at [facility], [address], [place], [place] [address]    Report Status 08/18/2023 FINAL  Final  Blood culture (routine x 2)     Status: None   Collection Time: 08/13/23  6:58 PM   Specimen: BLOOD  Result Value Ref Range Status   Specimen Description BLOOD LEFT ANTECUBITAL  Final   Special Requests   Final    BOTTLES DRAWN AEROBIC ONLY Blood Culture results may not be optimal due to an inadequate volume of blood received in culture bottles   Culture   Final    NO GROWTH 5 DAYS Performed at [facility], [address], [place], [place] [address]    Report Status 08/18/2023 FINAL  Final  MRSA Next Gen by PCR, Nasal     Status: None   Collection Time: 08/13/23 11:36 PM   Specimen: Nasal Mucosa; Nasal Swab  Result Value Ref Range Status   MRSA by PCR Next Gen NOT DETECTED NOT DETECTED Final    Comment: (NOTE) The GeneXpert MRSA Assay (FDA approved for NASAL specimens only), is one component of a comprehensive MRSA colonization surveillance program. It is not intended to diagnose MRSA infection nor to guide or monitor treatment for MRSA infections. Test performance is not FDA approved in patients less than 23  years old. Performed at [facility] [place], [address] [place], [place] [address]     Radiology Studies: DG Abd 1 View Result Date: 08/18/2023 CLINICAL DATA:  Bowel obstruction EXAM: ABDOMEN - 1 VIEW COMPARISON:  August 16, 2023 FINDINGS: Multiple persistently dilated bowel loops in the central portion of the abdomen could correlate with partial or early small bowel obstruction. IMPRESSION: Multiple persistently dilated bowel loops in the central portion of the abdomen could correlate with partial or early small bowel obstruction. Electronically Signed   By: Fredrich Jefferson M.D.   On: 08/18/2023 08:47   Scheduled Meds:  bisacodyl  10 mg Rectal BID   Chlorhexidine  Gluconate Cloth  6 each Topical Daily   Chlorhexidine  Gluconate Cloth  6 each Topical Q0600   Continuous Infusions:  cefTRIAXone  (ROCEPHIN )  IV Stopped (08/19/23 0036)   dextrose  5 % and 0.45 % NaCl     doxycycline  (VIBRAMYCIN ) IV Stopped (08/19/23 0318)    LOS: 6 days   Colin Dawley M.D on 08/19/2023 at 10:12 AM  Go to [email or web address] - for contact info  [facility] - Office  [phone]  If 7PM-7AM, please contact night-coverage [email or web address] 08/19/2023, 10:12 AM

## 2023-08-20 ENCOUNTER — Inpatient Hospital Stay (HOSPITAL_COMMUNITY)

## 2023-08-20 ENCOUNTER — Encounter (HOSPITAL_COMMUNITY): Payer: Self-pay | Admitting: Certified Registered"

## 2023-08-20 DIAGNOSIS — K56609 Unspecified intestinal obstruction, unspecified as to partial versus complete obstruction: Secondary | ICD-10-CM | POA: Diagnosis not present

## 2023-08-20 LAB — GLUCOSE, CAPILLARY
Glucose-Capillary: 135 mg/dL — ABNORMAL HIGH (ref 70–99)
Glucose-Capillary: 156 mg/dL — ABNORMAL HIGH (ref 70–99)
Glucose-Capillary: 150 mg/dL — ABNORMAL HIGH (ref 70–99)

## 2023-08-20 MED ORDER — DEXTROSE-SODIUM CHLORIDE 5-0.45 % IV SOLN: INTRAVENOUS | Status: AC

## 2023-08-20 NOTE — Progress Notes (Signed)
 PROGRESS NOTE  Joseph Conrad, is a 68 y.o. adult, DOB - 1956/02/23, [id number]  Admit date - 08/13/2023   Admitting Physician Ejiroghene Debroah Fanning, MD  Outpatient Primary MD for the patient is [facility]  LOS - 7  CC---no urine     Brief Narrative:  68 y.o. male with medical history significant for CKD IV, diabetes mellitus, hypertension, OSA admitted on 08/13/2023 with AKI on CKD in the setting of SBO   -Assessment and Plan: 1)SBO (small bowel obstruction) --POA -- Recommends continue NG tube and conservative approach for now - Patient denies prior surgeries.  Colonoscopy 2021 - diverticulosis, 1 polyp and internal hemorrhoids. -Gen Surgery consult from Dr. Collene Dawson appreciated--- 08/20/23 - Continue IV Dextrose  fluid to avoid Hypoglycemia while awaiting return of bowel function and tolerance of oral intake - Had few BMs overnight after Gastrografin contrast study for  small bowel follow-through on 08/19/2023  --Follow-up x-ray shows contrast in colon and improving bowel dilatation - Per general surgeon okay to hold off on surgical intervention at this time - Try liquid diet and clamp NG--keep NG to gravity - If symptoms fail to resolve further may still need surgery in the near future  2)Persistent Hypoglycemia--- due to #1 above -Hold PTA insulin  - Blood glucose was down to the 50s---Now stable  with iv Dextrose  - Continuous IV dextrose  drip for now until patient can tolerate oral intake as above in # 1  3)AKI----acute kidney injury on CKD stage - IV--in the setting of dehydration with volume depletion and hypotension due to #1, compounded by losartan use PTA -  creatinine on admission= 12.37 - baseline creatinine =  3.1 (05/03/23)  ,  creatinine  13.40 ([facility]) , -Hold PTA losartan - renally adjust medications, avoid nephrotoxic agents / dehydration  / hypotension - Status post right IJ temporary catheter placement on 08/14/2023   - Nephrology input  appreciated -Initiated HD on 08/15/23 (then had HD on 08/16/23 and 08/18/23) 08/20/23 - Having urine output -Per nephrologist  okay to hold off on further hemodialysis at this time, hoping that patient will Not need additional hemodialysis going forward --  4)Aspiration Pneumonia/CAP---POA - Suspect due to #1 above with persistent emesis - Okay to complete 7 days of Rocephin /Doxy - prn bronchodilators  5)HTN--was hypotensive due to volume depletion in setting of # 1 above - continue to Hold Carvedilol , Losartan and Norvasc 5 mg  6)Hypocalcemia--when corrected for low albumin he has mild hypocalcemia --Replaced  7)Hyperphosphatemia ---in the setting of # 1 above  Status is: Inpatient   Disposition: The patient is from: Home              Anticipated d/c is to: Home              Anticipated d/c date is: > 3 days              Patient currently is not medically stable to d/c. Barriers: Not Clinically Stable-   Code Status :  -  Code Status: Full Code   Family Communication:    (patient is alert, awake and coherent)  Discussed with S/o DEE at bedside  DVT Prophylaxis  :   - SCDs   SCD's Start: 08/19/23 2012 SCDs Start: 08/13/23 2359  Lab Results  Component Value Date   PLT 143 (L) 08/19/2023   Inpatient Medications  Scheduled Meds:  bisacodyl  10 mg Rectal BID   Chlorhexidine  Gluconate Cloth  6 each Topical Daily   Chlorhexidine  Gluconate  Cloth  6 each Topical Q0600   Continuous Infusions:  cefoTEtan (CEFOTAN) IV     doxycycline  (VIBRAMYCIN ) IV 100 mg (08/20/23 0139)   promethazine  (PHENERGAN ) injection (IM or IVPB)     PRN Meds:.acetaminophen  **OR** acetaminophen , albuterol , HYDROmorphone  (DILAUDID ) injection, phenol, promethazine  **OR** promethazine  (PHENERGAN ) injection (IM or IVPB) **OR** promethazine    Anti-infectives (From admission, onward)    Start     Dose/Rate Route Frequency Ordered Stop   08/20/23 0600  cefoTEtan (CEFOTAN) 2 g in sodium chloride  0.9 %  100 mL IVPB        2 g 200 mL/hr over 30 Minutes Intravenous On call to O.R. 08/19/23 2011 08/21/23 0559   08/14/23 0100  cefTRIAXone  (ROCEPHIN ) 2 g in sodium chloride  0.9 % 100 mL IVPB        2 g 200 mL/hr over 30 Minutes Intravenous Every 24 hours 08/14/23 0000 08/20/23 0031   08/14/23 0100  doxycycline  (VIBRAMYCIN ) 100 mg in sodium chloride  0.9 % 250 mL IVPB        100 mg 125 mL/hr over 120 Minutes Intravenous Every 12 hours 08/14/23 0000 08/21/23 0059      Subjective: [name] today has no fevers, No chest pain,    - Had few BMs after Gastrografin for small bowel follow-through study --  Objective: Vitals:   08/20/23 0800 08/20/23 0804 08/20/23 0900 08/20/23 1000  BP: 123/74  (!) 145/75 137/80  Pulse: 88  86 84  Resp: 17  13 19   Temp:  97.7 F (36.5 C)    TempSrc:  Oral    SpO2: 92%  95% 96%  Weight:      Height:        Intake/Output Summary (Last 24 hours) at 08/20/2023 1128 Last data filed at 08/20/2023 0532 Gross per 24 hour  Intake 489.71 ml  Output 1300 ml  Net -810.29 ml   Filed Weights   08/16/23 1713 08/17/23 0458 08/20/23 0342  Weight: 111.2 kg 111.2 kg 113.2 kg   Physical Exam Gen:- Awake Alert,  in no apparent distress  HEENT:- .AT, No sclera icterus Nose-NG tube in situ--to gravity Neck-Supple Neck, right IJ HD catheter Lungs-  CTAB , fair symmetrical air movement CV- S1, S2 normal, regular  Abd-  Abd Soft, improving abdominal distention, +ve bowel sounds Extremity/Skin:- No  edema, pedal pulses present  Psych-affect is appropriate, oriented x3 Neuro-generalized weakness, no new focal deficits, no tremors  Data Reviewed: I have personally reviewed following labs and imaging studies  CBC: Recent Labs  Lab 08/13/23 1858 08/14/23 0442 08/18/23 0424 08/19/23 0853  WBC 7.2 7.7 7.9 9.5  NEUTROABS 4.7  --   --   --   HGB 15.1 13.6 12.5* 12.6*  HCT 45.9 41.9 37.9* 39.0  MCV 85.8 83.8 86.5 85.5  PLT 255 222 156 143*   Basic  Metabolic Panel: Recent Labs  Lab 08/14/23 0442 08/15/23 0419 08/16/23 0255 08/17/23 0211 08/17/23 1553 08/18/23 0424 08/19/23 0853  NA 138 139 136 137  --  142 141  K 3.7 3.5 3.4* 3.3* 4.2 3.9 4.2  CL 89* 90* 93* 95*  --  102 102  CO2 26 26 26 26   --  26 24  GLUCOSE 144* 152* 142* 130*  --  143* 161*  BUN 116* 133* 93* 75*  --  84* 83*  CREATININE 12.85* 13.40* 9.84* 7.40*  --  7.40* 6.67*  CALCIUM  6.9* 6.4* 7.1* 7.4*  --  8.5* 8.6*  MG 2.5*  --   --  2.1  --   --   --   PHOS  --  7.3* 5.7*  --   --  6.1* 5.5*   GFR: Estimated Creatinine Clearance (by C-G formula based on SCr of 6.67 mg/dL (H)) Male: 14.7 mL/min (A) Male: 13.3 mL/min (A) Liver Function Tests: Recent Labs  Lab 08/13/23 1858 08/15/23 0419 08/16/23 0255 08/18/23 0424 08/19/23 0853  AST 18  --   --   --   --   ALT 25  --   --   --   --   ALKPHOS 61  --   --   --   --   BILITOT 0.7  --   --   --   --   PROT 7.8  --   --   --   --   ALBUMIN 3.5 2.5* 2.6* 2.5* 2.6*    Recent Results (from the past 240 hours)  Blood culture (routine x 2)     Status: None   Collection Time: 08/13/23  6:58 PM   Specimen: BLOOD  Result Value Ref Range Status   Specimen Description BLOOD RIGHT ANTECUBITAL  Final   Special Requests   Final    BOTTLES DRAWN AEROBIC AND ANAEROBIC Blood Culture adequate volume   Culture   Final    NO GROWTH 5 DAYS Performed at [facility], [address] [place], [place] [address]    Report Status 08/18/2023 FINAL  Final  Blood culture (routine x 2)     Status: None   Collection Time: 08/13/23  6:58 PM   Specimen: BLOOD  Result Value Ref Range Status   Specimen Description BLOOD LEFT ANTECUBITAL  Final   Special Requests   Final    BOTTLES DRAWN AEROBIC ONLY Blood Culture results may not be optimal due to an inadequate volume of blood received in culture bottles   Culture   Final    NO GROWTH 5 DAYS Performed at [facility], [address] [place], [place] [address]    Report  Status 08/18/2023 FINAL  Final  MRSA Next Gen by PCR, Nasal     Status: None   Collection Time: 08/13/23 11:36 PM   Specimen: Nasal Mucosa; Nasal Swab  Result Value Ref Range Status   MRSA by PCR Next Gen NOT DETECTED NOT DETECTED Final    Comment: (NOTE) The GeneXpert MRSA Assay (FDA approved for NASAL specimens only), is one component of a comprehensive MRSA colonization surveillance program. It is not intended to diagnose MRSA infection nor to guide or monitor treatment for MRSA infections. Test performance is not FDA approved in patients less than 3 years old. Performed at [facility], [address], [place], [place] [address]     Radiology Studies: DG Abd 1 View Result Date: 08/20/2023 CLINICAL DATA:  Small bowel obstruction EXAM: ABDOMEN - 1 VIEW COMPARISON:  08/19/2023 FINDINGS: Contrast medium observed in the colon and rectum. Abnormal dilated small bowel loops up to 5.7 cm in diameter, stable from 08/19/2023. Nasogastric tube noted.  Lumbar and lower thoracic spondylosis. IMPRESSION: 1. Contrast medium observed in the colon and rectum. 2. Abnormal dilated small bowel loops up to 5.7 cm in diameter, stable from 08/19/2023. 3. Nasogastric tube noted. Electronically Signed   By: Freida Jes M.D.   On: 08/20/2023 11:10   DG Abd Portable 1V-Small Bowel Obstruction Protocol-initial, 8 hr delay Result Date: 08/19/2023 CLINICAL DATA:  Bowel obstruction EXAM: PORTABLE ABDOMEN - 1 VIEW COMPARISON:  None Available. FINDINGS: Dilated loops  of small bowel consistent with obstruction or ileus. There has been interval improvement. Oral contrast in the colon and rectosigmoid. No radiopaque stones in in. IMPRESSION: Improving small bowel dilatation consistent with obstruction or ileus. Electronically Signed   By: Sydell Eva M.D.   On: 08/19/2023 19:05   Scheduled Meds:  bisacodyl  10 mg Rectal BID   Chlorhexidine  Gluconate Cloth  6 each Topical Daily   Chlorhexidine  Gluconate Cloth  6  each Topical Q0600   Continuous Infusions:  cefoTEtan (CEFOTAN) IV     doxycycline  (VIBRAMYCIN ) IV 100 mg (08/20/23 0139)   promethazine  (PHENERGAN ) injection (IM or IVPB)      LOS: 7 days   Colin Dawley M.D on 08/20/2023 at 11:28 AM  Go to [email or web address] - for contact info  [facility] - Office  [phone]  If 7PM-7AM, please contact night-coverage [email or web address] 08/20/2023, 11:28 AM

## 2023-08-20 NOTE — Plan of Care (Signed)

## 2023-08-20 NOTE — Progress Notes (Signed)
 [facility] Progress Note   Assessment/ Plan:   1. Acute kidney Injury on chronic kidney disease stage IV: Suspected likely secondary to ATN in the setting of small bowel obstruction with third spacing/volume depletion/hypotension and concomitant therapy.  He received hemodialysis late last week/over the weekend via a temporary right IJ dialysis catheter and dialysis was held on Monday and Tuesday encouraged by increasing urine output and improvement of creatinine.  Awaiting labs from this morning to decide on additional management. 2.  Small bowel obstruction: Small bowel follow-through done yesterday showed less dilated small bowel with contrast in the colon.  KUB this morning shows distended small bowel loops with contrast in colon-await decision regarding exploratory laparotomy by Dr. Collene Dawson. 3.  Hypertension: Blood pressure noted to be marginally elevated, continue to monitor with intravenous fluids and off of antihypertensive therapy. 4.  Aspiration pneumonia: On antibiotic therapy ceftriaxone  and doxycycline .  Orders noted for preop cefotetan.  Subjective:   Denies any acute events overnight, denies chest pain or shortness of breath and reports BM/flatus   Objective:   BP 128/85   Pulse 90   Temp 98.8 F (37.1 C) (Axillary)   Resp 13   Ht 5' 8 (1.727 m)   Wt 113.2 kg   SpO2 93%   BMI 37.95 kg/m   Intake/Output Summary (Last 24 hours) at 08/20/2023 0802 Last data filed at 08/20/2023 0532 Gross per 24 hour  Intake 489.71 ml  Output 1300 ml  Net -810.29 ml   Weight change:   Physical Exam: Gen: Appears comfortable resting in bed.  NGT in place CVS: Regular rhythm/normal rate, normal S1 and S2 Resp: Decreased breath sounds at the bases, clear to auscultation anteriorly no rales/rhonchi Abd: Soft, moderately distended, bowel sounds audible Ext: No lower extremity edema  Imaging: DG Abd Portable 1V-Small Bowel Obstruction Protocol-initial, 8 hr delay Result  Date: 08/19/2023 CLINICAL DATA:  Bowel obstruction EXAM: PORTABLE ABDOMEN - 1 VIEW COMPARISON:  None Available. FINDINGS: Dilated loops of small bowel consistent with obstruction or ileus. There has been interval improvement. Oral contrast in the colon and rectosigmoid. No radiopaque stones in in. IMPRESSION: Improving small bowel dilatation consistent with obstruction or ileus. Electronically Signed   By: Sydell Eva M.D.   On: 08/19/2023 19:05    Labs: BMET Recent Labs  Lab 08/13/23 1858 08/14/23 0442 08/15/23 0419 08/16/23 0255 08/17/23 0211 08/17/23 1553 08/18/23 0424 08/19/23 0853  NA 142 138 139 136 137  --  142 141  K 4.1 3.7 3.5 3.4* 3.3* 4.2 3.9 4.2  CL 88* 89* 90* 93* 95*  --  102 102  CO2 24 26 26 26 26   --  26 24  GLUCOSE 66* 144* 152* 142* 130*  --  143* 161*  BUN 96* 116* 133* 93* 75*  --  84* 83*  CREATININE 12.37* 12.85* 13.40* 9.84* 7.40*  --  7.40* 6.67*  CALCIUM  7.8* 6.9* 6.4* 7.1* 7.4*  --  8.5* 8.6*  PHOS  --   --  7.3* 5.7*  --   --  6.1* 5.5*   CBC Recent Labs  Lab 08/13/23 1858 08/14/23 0442 08/18/23 0424 08/19/23 0853  WBC 7.2 7.7 7.9 9.5  NEUTROABS 4.7  --   --   --   HGB 15.1 13.6 12.5* 12.6*  HCT 45.9 41.9 37.9* 39.0  MCV 85.8 83.8 86.5 85.5  PLT 255 222 156 143*    Medications:     bisacodyl  10 mg Rectal BID  Chlorhexidine  Gluconate Cloth  6 each Topical Daily   Chlorhexidine  Gluconate Cloth  6 each Topical Q0600    Clevester Dally, MD 08/20/2023, 8:02 AM

## 2023-08-20 NOTE — Plan of Care (Signed)

## 2023-08-20 NOTE — Progress Notes (Addendum)
 [facility] Progress Note     Subjective: Kub with more contrast in colon and dilated loops, had more Bms overnight. Feeling good.   Discussed potential this will resolve and continue to improve or will fail if we trial po.   Objective: Vital signs in last 24 hours: Temp:  [97.5 F (36.4 C)-98.9 F (37.2 C)] 97.7 F (36.5 C) (06/18 0804) Pulse Rate:  [80-98] 90 (06/18 0600) Resp:  [13-19] 13 (06/18 0600) BP: (110-149)/(67-85) 128/85 (06/18 0600) SpO2:  [91 %-95 %] 93 % (06/18 0600) Weight:  [113.2 kg] 113.2 kg (06/18 0342) Last BM Date : 08/19/23  Intake/Output from previous day: 06/17 0701 - 06/18 0700 In: 489.7 [I.V.:239.7; IV Piggyback:250] Out: 1300 [Urine:700; Emesis/NG output:600] Intake/Output this shift: No intake/output data recorded.  General appearance: alert and no distress GI: soft, nondistended, nontender  Lab Results:  Recent Labs    08/18/23 0424 08/19/23 0853  WBC 7.9 9.5  HGB 12.5* 12.6*  HCT 37.9* 39.0  PLT 156 143*   BMET Recent Labs    08/18/23 0424 08/19/23 0853  NA 142 141  K 3.9 4.2  CL 102 102  CO2 26 24  GLUCOSE 143* 161*  BUN 84* 83*  CREATININE 7.40* 6.67*  CALCIUM  8.5* 8.6*   PT/INR No results for input(s): LABPROT, INR in the last 72 hours.  Studies/Results: DG Abd Portable 1V-Small Bowel Obstruction Protocol-initial, 8 hr delay Result Date: 08/19/2023 CLINICAL DATA:  Bowel obstruction EXAM: PORTABLE ABDOMEN - 1 VIEW COMPARISON:  None Available. FINDINGS: Dilated loops of small bowel consistent with obstruction or ileus. There has been interval improvement. Oral contrast in the colon and rectosigmoid. No radiopaque stones in in. IMPRESSION: Improving small bowel dilatation consistent with obstruction or ileus. Electronically Signed   By: Sydell Eva M.D.   On: 08/19/2023 19:05    Anti-infectives: Anti-infectives (From admission, onward)    Start     Dose/Rate Route Frequency Ordered Stop    08/20/23 0600  cefoTEtan (CEFOTAN) 2 g in sodium chloride  0.9 % 100 mL IVPB        2 g 200 mL/hr over 30 Minutes Intravenous On call to O.R. 08/19/23 2011 08/21/23 0559   08/14/23 0100  cefTRIAXone  (ROCEPHIN ) 2 g in sodium chloride  0.9 % 100 mL IVPB        2 g 200 mL/hr over 30 Minutes Intravenous Every 24 hours 08/14/23 0000 08/20/23 0031   08/14/23 0100  doxycycline  (VIBRAMYCIN ) 100 mg in sodium chloride  0.9 % 250 mL IVPB        100 mg 125 mL/hr over 120 Minutes Intravenous Every 12 hours 08/14/23 0000 08/21/23 0059       Assessment/Plan: Patient with resolving pSBO at this time. Doing fair. Will hold on any surgery today. Clear diet. NG To gravity/ foley bag.   Will see how he does and if has more bowel movements. If vomits or has nausea not controlled by meds, will need NG back to suction Discussed if he fails, may need surgery   Will see when nephrology wants dialysis catheter removed?   LOS: 7 days    Joseph Conrad 08/20/2023

## 2023-08-21 DIAGNOSIS — K56609 Unspecified intestinal obstruction, unspecified as to partial versus complete obstruction: Secondary | ICD-10-CM | POA: Diagnosis not present

## 2023-08-21 LAB — RENAL FUNCTION PANEL
Albumin: 2.4 g/dL — ABNORMAL LOW (ref 3.5–5.0)
Anion gap: 9 (ref 5–15)
BUN: 80 mg/dL — ABNORMAL HIGH (ref 8–23)
CO2: 26 mmol/L (ref 22–32)
Calcium: 8.6 mg/dL — ABNORMAL LOW (ref 8.9–10.3)
Chloride: 109 mmol/L (ref 98–111)
Creatinine, Ser: 5.63 mg/dL — ABNORMAL HIGH (ref 0.61–1.24)
GFR, Estimated: 10 mL/min — ABNORMAL LOW (ref >60)
Glucose, Bld: 131 mg/dL — ABNORMAL HIGH (ref 70–99)
Phosphorus: 4.8 mg/dL — ABNORMAL HIGH (ref 2.5–4.6)
Potassium: 3.6 mmol/L (ref 3.5–5.1)
Sodium: 144 mmol/L (ref 135–145)

## 2023-08-21 LAB — GLUCOSE, CAPILLARY
Glucose-Capillary: 121 mg/dL — ABNORMAL HIGH (ref 70–99)
Glucose-Capillary: 128 mg/dL — ABNORMAL HIGH (ref 70–99)
Glucose-Capillary: 160 mg/dL — ABNORMAL HIGH (ref 70–99)

## 2023-08-21 MED ORDER — PANTOPRAZOLE SODIUM 40 MG PO TBEC
40.0000 mg | DELAYED_RELEASE_TABLET | Freq: Every day | ORAL | Status: DC
  Administered 2023-08-21 – 2023-08-22 (×2): 40 mg via ORAL
  Filled 2023-08-21 (×2): qty 1

## 2023-08-21 MED ORDER — CARVEDILOL 3.125 MG PO TABS
6.2500 mg | ORAL_TABLET | Freq: Two times a day (BID) | ORAL | Status: DC
  Administered 2023-08-21 – 2023-08-22 (×2): 6.25 mg via ORAL
  Filled 2023-08-21 (×2): qty 2

## 2023-08-21 MED ORDER — HEPARIN SODIUM (PORCINE) 5000 UNIT/ML IJ SOLN
5000.0000 [IU] | Freq: Three times a day (TID) | INTRAMUSCULAR | Status: DC
  Administered 2023-08-21 – 2023-08-22 (×3): 5000 [IU] via SUBCUTANEOUS
  Filled 2023-08-21 (×3): qty 1

## 2023-08-21 MED ORDER — AMLODIPINE BESYLATE 5 MG PO TABS
10.0000 mg | ORAL_TABLET | Freq: Every day | ORAL | Status: DC
  Administered 2023-08-21 – 2023-08-22 (×2): 10 mg via ORAL
  Filled 2023-08-21 (×2): qty 2

## 2023-08-21 NOTE — Plan of Care (Signed)

## 2023-08-21 NOTE — Progress Notes (Signed)
 [facility]  Patient seen this am.  Doing well, tolerating a full liquid diet having bowel movements.  Abdomen soft non-distended nontender   Diet advanced to a soft diet. Nephrology plans to potentially get his dialysis catheter out tomorrow.  Deena Farrier MD

## 2023-08-21 NOTE — Plan of Care (Signed)
  Problem: Education: Goal: Knowledge of General Education information will improve Description: Including pain rating scale, medication(s)/side effects and non-pharmacologic comfort measures Outcome: Progressing   Problem: Coping: Goal: Level of anxiety will decrease Outcome: Progressing   Problem: Pain Managment: Goal: General experience of comfort will improve and/or be controlled Outcome: Progressing

## 2023-08-21 NOTE — Progress Notes (Signed)
 PROGRESS NOTE  Joseph Conrad, is a 69 y.o. adult, DOB - May 28, 1955, [id number]  Admit date - 08/13/2023   Admitting Physician Ejiroghene Debroah Fanning, MD  Outpatient Primary MD for the patient is [facility]  LOS - 8  CC---no urine     Brief Narrative:  68 y.o. male with medical history significant for CKD IV, diabetes mellitus, hypertension, OSA admitted on 08/13/2023 with AKI on CKD in the setting of SBO   -Assessment and Plan: 1)SBO (small bowel obstruction) --POA -- Recommends continue NG tube and conservative approach for now - Patient denies prior surgeries.  Colonoscopy 2021 - diverticulosis, 1 polyp and internal hemorrhoids. -Gen Surgery consult from Dr. Collene Dawson appreciated--- 08/21/23 - Had few BMs overnight after Gastrografin contrast study for  small bowel follow-through on 08/19/2023  --Follow-up x-ray shows contrast in colon and improving bowel dilatation - Per general surgeon okay to hold off on surgical intervention at this time had more BMs (liquid) -Tolerating full liquid diet  -NG tube removed -Will advance to soft diet  2)Persistent Hypoglycemia--- due to #1 above -Hold PTA insulin  - Blood glucose was down to the 50s---Now stable  with iv Dextrose  - Will stop IV dextrose  solution once tolerating solid food well  3)AKI----acute kidney injury on CKD stage - IV--in the setting of dehydration with volume depletion and hypotension due to #1, compounded by losartan use PTA -  creatinine on admission= 12.37 - baseline creatinine =  3.1 (05/03/23)  ,  creatinine  13.40 ([place]) , -Creatinine trending down currently at 5.63 -Hold PTA losartan - renally adjust medications, avoid nephrotoxic agents / dehydration  / hypotension - Status post right IJ temporary catheter placement on 08/14/2023   - Nephrology input appreciated -Initiated HD on 08/15/23 (then had HD on 08/16/23 and 08/18/23) 08/21/23 - Having urine output -Per nephrologist  okay to hold  off on further hemodialysis at this time, hoping that patient will Not need additional hemodialysis going forward -- 4)Aspiration Pneumonia/CAP---POA - Suspect due to #1 above with persistent emesis - completed 7 days of Rocephin /Doxy - prn bronchodilators  5)HTN--was hypotensive due to volume depletion in setting of # 1 above -BP is now elevated -Okay to restart PTA amlodipine and Coreg  -Continue to hold Losartan  6)Hypocalcemia----Replaced - When corrected for hypoalbuminemia--- calcium  has actually normalized  7)Hyperphosphatemia ---in the setting of # 1 above, actually improving  Status is: Inpatient   Disposition: The patient is from: Home              Anticipated d/c is to: Home              Anticipated d/c date is: 1 day              Patient currently is not medically stable to d/c. Barriers: Not Clinically Stable-   Code Status :  -  Code Status: Full Code   Family Communication:    (patient is alert, awake and coherent)  Discussed with S/o DEE at bedside  DVT Prophylaxis  :   - SCDs   heparin  injection 5,000 Units Start: 08/21/23 1400 Place and maintain sequential compression device Start: 08/21/23 0759 SCD's Start: 08/19/23 2012 SCDs Start: 08/13/23 2359  Lab Results  Component Value Date   PLT 143 (L) 08/19/2023   Inpatient Medications  Scheduled Meds:  amLODipine  10 mg Oral Daily   bisacodyl  10 mg Rectal BID   Chlorhexidine  Gluconate Cloth  6 each Topical Daily   Chlorhexidine  Gluconate Cloth  6 each Topical Q0600   heparin  injection (subcutaneous)  5,000 Units Subcutaneous Q8H   pantoprazole  40 mg Oral Daily   Continuous Infusions:  promethazine  (PHENERGAN ) injection (IM or IVPB)     PRN Meds:.acetaminophen  **OR** acetaminophen , albuterol , HYDROmorphone  (DILAUDID ) injection, phenol, promethazine  **OR** promethazine  (PHENERGAN ) injection (IM or IVPB) **OR** promethazine    Anti-infectives (From admission, onward)    Start     Dose/Rate Route  Frequency Ordered Stop   08/20/23 0600  cefoTEtan (CEFOTAN) 2 g in sodium chloride  0.9 % 100 mL IVPB        2 g 200 mL/hr over 30 Minutes Intravenous On call to O.R. 08/19/23 2011 08/21/23 0559   08/14/23 0100  cefTRIAXone  (ROCEPHIN ) 2 g in sodium chloride  0.9 % 100 mL IVPB        2 g 200 mL/hr over 30 Minutes Intravenous Every 24 hours 08/14/23 0000 08/20/23 0031   08/14/23 0100  doxycycline  (VIBRAMYCIN ) 100 mg in sodium chloride  0.9 % 250 mL IVPB        100 mg 125 mL/hr over 120 Minutes Intravenous Every 12 hours 08/14/23 0000 08/20/23 1429      Subjective: [name] today has no fevers, No chest pain,    - had more BMs (liquid) -Tolerating full liquid diet  -Will advance to soft diet --  Objective: Vitals:   08/21/23 0906 08/21/23 1000 08/21/23 1158 08/21/23 1200  BP: 123/68 127/82  127/89  Pulse:  97  92  Resp:  17  16  Temp:   97.6 F (36.4 C)   TempSrc:   Oral   SpO2:  96%  96%  Weight:      Height:        Intake/Output Summary (Last 24 hours) at 08/21/2023 1321 Last data filed at 08/21/2023 0857 Gross per 24 hour  Intake 1270.21 ml  Output 2075 ml  Net -804.79 ml   Filed Weights   08/16/23 1713 08/17/23 0458 08/20/23 0342  Weight: 111.2 kg 111.2 kg 113.2 kg   Physical Exam Gen:- Awake Alert,  in no apparent distress  HEENT:- [place].AT, No sclera icterus Nose-NG tube removed  Neck-Supple Neck, right IJ HD catheter Lungs-  CTAB , fair symmetrical air movement CV- S1, S2 normal, regular  Abd-  Abd Soft, much improved abdominal distention, +ve bowel sounds Extremity/Skin:- No  edema, pedal pulses present  Psych-affect is appropriate, oriented x3 Neuro-generalized weakness, no new focal deficits, no tremors  Data Reviewed: I have personally reviewed following labs and imaging studies  CBC: Recent Labs  Lab 08/18/23 0424 08/19/23 0853  WBC 7.9 9.5  HGB 12.5* 12.6*  HCT 37.9* 39.0  MCV 86.5 85.5  PLT 156 143*   Basic Metabolic Panel: Recent  Labs  Lab 08/15/23 0419 08/16/23 0255 08/17/23 0211 08/17/23 1553 08/18/23 0424 08/19/23 0853 08/21/23 0422  NA 139 136 137  --  142 141 144  K 3.5 3.4* 3.3* 4.2 3.9 4.2 3.6  CL 90* 93* 95*  --  102 102 109  CO2 26 26 26   --  26 24 26   GLUCOSE 152* 142* 130*  --  143* 161* 131*  BUN 133* 93* 75*  --  84* 83* 80*  CREATININE 13.40* 9.84* 7.40*  --  7.40* 6.67* 5.63*  CALCIUM  6.4* 7.1* 7.4*  --  8.5* 8.6* 8.6*  MG  --   --  2.1  --   --   --   --   PHOS 7.3* 5.7*  --   --  6.1* 5.5* 4.8*   GFR: Estimated Creatinine Clearance (by C-G formula based on SCr of 5.63 mg/dL (H)) Male: 13 mL/min (A) Male: 15.8 mL/min (A) Liver Function Tests: Recent Labs  Lab 08/15/23 0419 08/16/23 0255 08/18/23 0424 08/19/23 0853 08/21/23 0422  ALBUMIN 2.5* 2.6* 2.5* 2.6* 2.4*    Recent Results (from the past 240 hours)  Blood culture (routine x 2)     Status: None   Collection Time: 08/13/23  6:58 PM   Specimen: BLOOD  Result Value Ref Range Status   Specimen Description BLOOD RIGHT ANTECUBITAL  Final   Special Requests   Final    BOTTLES DRAWN AEROBIC AND ANAEROBIC Blood Culture adequate volume   Culture   Final    NO GROWTH 5 DAYS Performed at [facility], [address], [place], [place] [address]    Report Status 08/18/2023 FINAL  Final  Blood culture (routine x 2)     Status: None   Collection Time: 08/13/23  6:58 PM   Specimen: BLOOD  Result Value Ref Range Status   Specimen Description BLOOD LEFT ANTECUBITAL  Final   Special Requests   Final    BOTTLES DRAWN AEROBIC ONLY Blood Culture results may not be optimal due to an inadequate volume of blood received in culture bottles   Culture   Final    NO GROWTH 5 DAYS Performed at [facility], [address] [place], [place] [address]    Report Status 08/18/2023 FINAL  Final  MRSA Next Gen by PCR, Nasal     Status: None   Collection Time: 08/13/23 11:36 PM   Specimen: Nasal Mucosa; Nasal Swab  Result Value Ref Range Status    MRSA by PCR Next Gen NOT DETECTED NOT DETECTED Final    Comment: (NOTE) The GeneXpert MRSA Assay (FDA approved for NASAL specimens only), is one component of a comprehensive MRSA colonization surveillance program. It is not intended to diagnose MRSA infection nor to guide or monitor treatment for MRSA infections. Test performance is not FDA approved in patients less than 50 years old. Performed at [facility], [address], [place], [place] [address]     Radiology Studies: DG Abd 1 View Result Date: 08/20/2023 CLINICAL DATA:  Small bowel obstruction EXAM: ABDOMEN - 1 VIEW COMPARISON:  08/19/2023 FINDINGS: Contrast medium observed in the colon and rectum. Abnormal dilated small bowel loops up to 5.7 cm in diameter, stable from 08/19/2023. Nasogastric tube noted.  Lumbar and lower thoracic spondylosis. IMPRESSION: 1. Contrast medium observed in the colon and rectum. 2. Abnormal dilated small bowel loops up to 5.7 cm in diameter, stable from 08/19/2023. 3. Nasogastric tube noted. Electronically Signed   By: Freida Jes M.D.   On: 08/20/2023 11:10   DG Abd Portable 1V-Small Bowel Obstruction Protocol-initial, 8 hr delay Result Date: 08/19/2023 CLINICAL DATA:  Bowel obstruction EXAM: PORTABLE ABDOMEN - 1 VIEW COMPARISON:  None Available. FINDINGS: Dilated loops of small bowel consistent with obstruction or ileus. There has been interval improvement. Oral contrast in the colon and rectosigmoid. No radiopaque stones in in. IMPRESSION: Improving small bowel dilatation consistent with obstruction or ileus. Electronically Signed   By: Sydell Eva M.D.   On: 08/19/2023 19:05   Scheduled Meds:  amLODipine  10 mg Oral Daily   bisacodyl  10 mg Rectal BID   Chlorhexidine  Gluconate Cloth  6 each Topical Daily   Chlorhexidine  Gluconate Cloth  6 each Topical Q0600   heparin  injection (subcutaneous)  5,000 Units Subcutaneous  Q8H   pantoprazole  40 mg Oral Daily   Continuous Infusions:   promethazine  (PHENERGAN ) injection (IM or IVPB)      LOS: 8 days   Colin Dawley M.D on 08/21/2023 at 1:21 PM  Go to [email or web address] - for contact info  [facility] - Office  [phone]  If 7PM-7AM, please contact night-coverage [email or web address] 08/21/2023, 1:21 PM

## 2023-08-21 NOTE — Progress Notes (Signed)
 [facility] Progress Note   Assessment/ Plan:   1. Acute kidney Injury on chronic kidney disease stage IV: Suspected likely secondary to ATN in the setting of small bowel obstruction with third spacing/volume depletion/hypotension and concomitant therapy.  He received hemodialysis late last week/over the weekend via a temporary right IJ dialysis catheter and dialysis was held since then; encouraged by increasing urine output and improvement of creatinine looks like a trend now.  If there is continued improvement tomorrow and plan for discharge over the weekend will remove temp Fri morning.  Looks great today 2.  Small bowel obstruction: Small bowel follow-through done yesterday showed less dilated small bowel with contrast in the colon.  Once better and tolerated p.o. yesterday with and to advance today as tolerated. 3.  Hypertension: Blood pressure noted to be marginally elevated, continue to monitor with intravenous fluids and off of antihypertensive therapy. 4.  Aspiration pneumonia: On antibiotic therapy; we did 7-day course of doxycycline .    Subjective:   Denies any acute events overnight, denies chest pain or shortness of breath and reports BM/flatus.    Objective:   BP (!) 152/69 (BP Location: Left Arm)   Pulse 95   Temp (!) 97.4 F (36.3 C) (Oral)   Resp 14   Ht 5' 8 (1.727 m)   Wt 113.2 kg   SpO2 94%   BMI 37.95 kg/m   Intake/Output Summary (Last 24 hours) at 08/21/2023 0900 Last data filed at 08/21/2023 0857 Gross per 24 hour  Intake 1270.21 ml  Output 2475 ml  Net -1204.79 ml   Weight change:   Physical Exam: Gen: Appears comfortable resting in bed.  NGT in place CVS: Regular rhythm/normal rate, normal S1 and S2 Resp: Decreased breath sounds at the bases, clear to auscultation anteriorly no rales/rhonchi Abd: Soft, moderately distended, bowel sounds audible Ext: No lower extremity edema Access: RIJ temp  Imaging: DG Abd 1 View Result Date:  08/20/2023 CLINICAL DATA:  Small bowel obstruction EXAM: ABDOMEN - 1 VIEW COMPARISON:  08/19/2023 FINDINGS: Contrast medium observed in the colon and rectum. Abnormal dilated small bowel loops up to 5.7 cm in diameter, stable from 08/19/2023. Nasogastric tube noted.  Lumbar and lower thoracic spondylosis. IMPRESSION: 1. Contrast medium observed in the colon and rectum. 2. Abnormal dilated small bowel loops up to 5.7 cm in diameter, stable from 08/19/2023. 3. Nasogastric tube noted. Electronically Signed   By: Freida Jes M.D.   On: 08/20/2023 11:10   DG Abd Portable 1V-Small Bowel Obstruction Protocol-initial, 8 hr delay Result Date: 08/19/2023 CLINICAL DATA:  Bowel obstruction EXAM: PORTABLE ABDOMEN - 1 VIEW COMPARISON:  None Available. FINDINGS: Dilated loops of small bowel consistent with obstruction or ileus. There has been interval improvement. Oral contrast in the colon and rectosigmoid. No radiopaque stones in in. IMPRESSION: Improving small bowel dilatation consistent with obstruction or ileus. Electronically Signed   By: Sydell Eva M.D.   On: 08/19/2023 19:05    Labs: BMET Recent Labs  Lab 08/15/23 0419 08/16/23 0255 08/17/23 0211 08/17/23 1553 08/18/23 0424 08/19/23 0853 08/21/23 0422  NA 139 136 137  --  142 141 144  K 3.5 3.4* 3.3* 4.2 3.9 4.2 3.6  CL 90* 93* 95*  --  102 102 109  CO2 26 26 26   --  26 24 26   GLUCOSE 152* 142* 130*  --  143* 161* 131*  BUN 133* 93* 75*  --  84* 83* 80*  CREATININE 13.40* 9.84* 7.40*  --  7.40* 6.67* 5.63*  CALCIUM  6.4* 7.1* 7.4*  --  8.5* 8.6* 8.6*  PHOS 7.3* 5.7*  --   --  6.1* 5.5* 4.8*   CBC Recent Labs  Lab 08/18/23 0424 08/19/23 0853  WBC 7.9 9.5  HGB 12.5* 12.6*  HCT 37.9* 39.0  MCV 86.5 85.5  PLT 156 143*    Medications:     amLODipine  10 mg Oral Daily   bisacodyl  10 mg Rectal BID   Chlorhexidine  Gluconate Cloth  6 each Topical Daily   Chlorhexidine  Gluconate Cloth  6 each Topical Q0600   heparin  injection  (subcutaneous)  5,000 Units Subcutaneous Q8H   pantoprazole  40 mg Oral Daily    Aloysius Janus, MD 08/21/2023, 9:00 AM

## 2023-08-22 DIAGNOSIS — K56609 Unspecified intestinal obstruction, unspecified as to partial versus complete obstruction: Secondary | ICD-10-CM | POA: Diagnosis not present

## 2023-08-22 LAB — RENAL FUNCTION PANEL
Albumin: 2.4 g/dL — ABNORMAL LOW (ref 3.5–5.0)
Anion gap: 11 (ref 5–15)
BUN: 77 mg/dL — ABNORMAL HIGH (ref 8–23)
CO2: 25 mmol/L (ref 22–32)
Calcium: 8.8 mg/dL — ABNORMAL LOW (ref 8.9–10.3)
Chloride: 110 mmol/L (ref 98–111)
Creatinine, Ser: 4.97 mg/dL — ABNORMAL HIGH (ref 0.61–1.24)
GFR, Estimated: 12 mL/min — ABNORMAL LOW (ref >60)
Glucose, Bld: 121 mg/dL — ABNORMAL HIGH (ref 70–99)
Phosphorus: 5.1 mg/dL — ABNORMAL HIGH (ref 2.5–4.6)
Potassium: 3.6 mmol/L (ref 3.5–5.1)
Sodium: 146 mmol/L — ABNORMAL HIGH (ref 135–145)

## 2023-08-22 LAB — CBC
HCT: 34.5 % — ABNORMAL LOW (ref 39.0–52.0)
Hemoglobin: 11 g/dL — ABNORMAL LOW (ref 13.0–17.0)
MCH: 28.3 pg (ref 26.0–34.0)
MCHC: 31.9 g/dL (ref 30.0–36.0)
MCV: 88.7 fL (ref 80.0–100.0)
Platelets: 95 10*3/uL — ABNORMAL LOW (ref 150–400)
RBC: 3.89 MIL/uL — ABNORMAL LOW (ref 4.22–5.81)
RDW: 13.7 % (ref 11.5–15.5)
WBC: 7.3 10*3/uL (ref 4.0–10.5)
nRBC: 0 % (ref 0.0–0.2)

## 2023-08-22 LAB — GLUCOSE, CAPILLARY
Glucose-Capillary: 109 mg/dL — ABNORMAL HIGH (ref 70–99)
Glucose-Capillary: 136 mg/dL — ABNORMAL HIGH (ref 70–99)
Glucose-Capillary: 151 mg/dL — ABNORMAL HIGH (ref 70–99)

## 2023-08-22 SURGERY — LAPAROTOMY, EXPLORATORY
Anesthesia: General

## 2023-08-22 MED ORDER — CARVEDILOL 12.5 MG PO TABS
12.5000 mg | ORAL_TABLET | Freq: Two times a day (BID) | ORAL | 5 refills | Status: AC
Start: 2023-08-22 — End: ?

## 2023-08-22 MED ORDER — AMLODIPINE BESYLATE 5 MG PO TABS: 5.0000 mg | ORAL_TABLET | Freq: Every day | ORAL | 5 refills | Status: AC

## 2023-08-22 MED ORDER — ACETAMINOPHEN 325 MG PO TABS: 650.0000 mg | ORAL_TABLET | Freq: Four times a day (QID) | ORAL | Status: AC | PRN

## 2023-08-22 MED ORDER — ASPIRIN 81 MG PO TBEC: 81.0000 mg | DELAYED_RELEASE_TABLET | Freq: Every day | ORAL | 11 refills | Status: AC

## 2023-08-22 MED ORDER — ALBUTEROL SULFATE (2.5 MG/3ML) 0.083% IN NEBU: 2.5000 mg | INHALATION_SOLUTION | RESPIRATORY_TRACT | 12 refills | Status: AC | PRN

## 2023-08-22 MED ORDER — PANTOPRAZOLE SODIUM 40 MG PO TBEC: 40.0000 mg | DELAYED_RELEASE_TABLET | Freq: Every day | ORAL | 5 refills | Status: AC

## 2023-08-22 MED ORDER — ONDANSETRON HCL 4 MG PO TABS: 4.0000 mg | ORAL_TABLET | Freq: Every day | ORAL | 1 refills | Status: AC | PRN

## 2023-08-22 NOTE — Plan of Care (Signed)
  Problem: Education: Goal: Knowledge of General Education information will improve Description: Including pain rating scale, medication(s)/side effects and non-pharmacologic comfort measures Outcome: Progressing   Problem: Activity: Goal: Risk for activity intolerance will decrease Outcome: Progressing   Problem: Coping: Goal: Level of anxiety will decrease Outcome: Progressing   Problem: Elimination: Goal: Will not experience complications related to bowel motility Outcome: Progressing   Problem: Pain Managment: Goal: General experience of comfort will improve and/or be controlled Outcome: Progressing   Problem: Safety: Goal: Ability to remain free from injury will improve Outcome: Progressing   Problem: Skin Integrity: Goal: Risk for impaired skin integrity will decrease Outcome: Progressing

## 2023-08-22 NOTE — Progress Notes (Signed)
 [facility] Progress Note   Assessment/ Plan:   1. Acute kidney Injury on chronic kidney disease stage IV: Suspected likely secondary to ATN in the setting of small bowel obstruction with third spacing/volume depletion/hypotension and concomitant therapy.  He received hemodialysis late last week/over the weekend via a temporary right IJ dialysis catheter and dialysis was held since then; encouraged by increasing urine output and improvement of creatinine looks like a trend now.    Continues to slowly improve; from the renal standpoint he is stable to be discharged with follow-up with his primary care or nephrologist in 2 weeks to ensure that he is back close to his baseline.    I have encouraged him that he needs to drink if he is thirsty and ensure he is getting adequate hydration as his serum sodium is rising a little bit.  I removed the sutures in the right IJ temporary catheter, removed the catheter during an expiratory cycle and held pressure for 5 minutes with hemostasis easily obtained.  I then put a bit pressure bandage over the site.  He tolerated the procedure very well without any complaints.  2.  Small bowel obstruction: Small bowel follow-through done yesterday showed less dilated small bowel with contrast in the colon.  Once better and tolerated a regular p.o.  diet yesterday. 3.  Hypertension: Blood pressure noted to be marginally elevated, continue to monitor with intravenous fluids and off of antihypertensive therapy. 4.  Aspiration pneumonia: On antibiotic therapy; we did 7-day course of doxycycline .    Subjective:   Denies any acute events overnight, denies chest pain or shortness of breath and reports BM/flatus.  Well with no complaints.  Tolerated regular diet yesterday.   Objective:   BP 127/68   Pulse 70   Temp 98.1 F (36.7 C) (Oral)   Resp 15   Ht 5' 8 (1.727 m)   Wt 113.2 kg   SpO2 90%   BMI 37.95 kg/m   Intake/Output Summary (Last 24 hours)  at 08/22/2023 1042 Last data filed at 08/22/2023 0900 Gross per 24 hour  Intake 240 ml  Output 400 ml  Net -160 ml   Weight change:   Physical Exam: Gen: Appears comfortable resting in bed.  NGT in place CVS: Regular rhythm/normal rate, normal S1 and S2 Resp: Decreased breath sounds at the bases, clear to auscultation anteriorly no rales/rhonchi Abd: Soft, moderately distended, bowel sounds audible Ext: No lower extremity edema Access: RIJ temp  Imaging: No results found.   Labs: BMET Recent Labs  Lab 08/16/23 0255 08/17/23 0211 08/17/23 1553 08/18/23 0424 08/19/23 0853 08/21/23 0422 08/22/23 0421  NA 136 137  --  142 141 144 146*  K 3.4* 3.3* 4.2 3.9 4.2 3.6 3.6  CL 93* 95*  --  102 102 109 110  CO2 26 26  --  26 24 26 25   GLUCOSE 142* 130*  --  143* 161* 131* 121*  BUN 93* 75*  --  84* 83* 80* 77*  CREATININE 9.84* 7.40*  --  7.40* 6.67* 5.63* 4.97*  CALCIUM  7.1* 7.4*  --  8.5* 8.6* 8.6* 8.8*  PHOS 5.7*  --   --  6.1* 5.5* 4.8* 5.1*   CBC Recent Labs  Lab 08/18/23 0424 08/19/23 0853 08/22/23 0421  WBC 7.9 9.5 7.3  HGB 12.5* 12.6* 11.0*  HCT 37.9* 39.0 34.5*  MCV 86.5 85.5 88.7  PLT 156 143* 95*    Medications:     amLODipine  10 mg Oral  Daily   carvedilol   6.25 mg Oral BID WC   Chlorhexidine  Gluconate Cloth  6 each Topical Daily   Chlorhexidine  Gluconate Cloth  6 each Topical Q0600   heparin  injection (subcutaneous)  5,000 Units Subcutaneous Q8H   pantoprazole  40 mg Oral Daily    Aloysius Janus, MD 08/22/2023, 10:42 AM

## 2023-08-22 NOTE — Discharge Summary (Incomplete)
 Joseph Conrad, is a 68 y.o. adult  DOB 06/12/1955  MRN [id number].  Admission date:  08/13/2023  Admitting Physician  Pati Bonine, MD  Discharge Date:  08/22/2023   Primary MD  [facility], [facility]  Recommendations for primary care physician for things to follow:  1)Very Low-salt diet advised---Less than 2 gm of Sodium per day advised----ok to use Mrs DASH salt substitute instead of Salt 2)Weigh yourself daily, call if you gain more than 3 pounds in 1 day or more than 5 pounds in 1 week as your diuretic medications may need to be adjusted 3)Avoid ibuprofen/Advil/Aleve /Motrin/Goody Powders/Naproxen /BC powders/Meloxicam/Diclofenac/Indomethacin and other Nonsteroidal anti-inflammatory medications as these will make you more likely to bleed and can cause stomach ulcers, can also cause Kidney problems.  4) please note that there has been several changes to your medications 5) please Repeat CBC and BMP Blood Tests every Monday for the next 3 weeks starting on Monday, 08/25/2023 6)-- Soft diet advised, avoid constipation  Admission Diagnosis  Hypovolemic shock (HCC) [R57.1] SBO (small bowel obstruction) (HCC) [K56.609] Decreased oral intake [R63.8] AKI (acute kidney injury) (HCC) [N17.9] Nausea and vomiting, unspecified vomiting type [R11.2]   Discharge Diagnosis  Hypovolemic shock (HCC) [R57.1] SBO (small bowel obstruction) (HCC) [K56.609] Decreased oral intake [R63.8] AKI (acute kidney injury) (HCC) [N17.9] Nausea and vomiting, unspecified vomiting type [R11.2]    Principal Problem:   SBO (small bowel obstruction) (HCC) Active Problems:   Aspiration pneumonia (HCC)   Hypotension   Acute kidney injury superimposed on CKD (HCC)   Hypoglycemia   Diabetes mellitus (HCC)   Essential hypertension   OSA (obstructive sleep apnea)      Past Medical History:  Diagnosis Date   Diabetes mellitus  without complication (HCC)    IDDM   Hypertension    Shortness of breath dyspnea    with exertion   Umbilical hernia     Past Surgical History:  Procedure Laterality Date   BIOPSY  12/02/2019   Procedure: BIOPSY;  Surgeon: Ruby Corporal, MD;  [facility];  Service: Endoscopy;;   CERVICAL FUSION     COLONOSCOPY N/A 12/02/2019   Procedure: COLONOSCOPY;  Surgeon: Ruby Corporal, MD;  [facility];  Service: Endoscopy;  Laterality: N/A;  830   ORIF PROXIMAL TIBIAL PLATEAU FRACTURE Left    UMBILICAL HERNIA REPAIR N/A 07/27/2015   Procedure: HERNIA REPAIR UMBILICAL ADULT;  Surgeon: Dareen Ebbing, MD;  Location: [facility];  Service: General;  Laterality: N/A;       HPI  from the history and physical done on the day of admission:    Chief Complaint: Abdominal Pain   HPI: [name] is a 68 y.o. male with medical history significant for chronic kidney disease, diabetes mellitus, hypertension, OSA. Patient presented to the ED with complaints of abdominal pain, weakness and vomiting. Patient reports symptoms started 4 days ago- 6/7 with vomiting, he reports large amount of vomiting, and reports he might have aspirated at some point.  He reports onset  of abdominal pain with bloating the next day.  Reports hypotension, reduced oral intake. He denies prior surgeries.  Reports he has had colonoscopy in the past. Denies difficulty breathing, no cough.   Patient sisters at bedside, she reports that her mother passed away a month ago.  She feels patient has been depressed, she is unsure but she thinks he might have been using illicit drugs like cocaine.  EMS was at his place 2 days ago but he refused to come to the ED.   ED Course: Temperature 97.4.  Heart rate 80s.  Respirate rate 19-24.  O2 sats 91% on room air on my evaluation.  Blood pressure systolic down to 16/10 improved to 101/67 with IV fluids. Creatinine markedly elevated at 12.37 from  baseline CKD 4.  Lactic acidosis 3.5 >> 2.2.  CBG- 66. EDP talked to general surgeon Dr. Collene Dawson, will see in consult in AM.  NG tube placed.   Review of Systems: As per HPI all other systems reviewed and negative.   Hospital Course:   Brief Narrative:  68 y.o. male with medical history significant for CKD IV, diabetes mellitus, hypertension, OSA admitted on 08/13/2023 with AKI on CKD in the setting of SBO   -Assessment and Plan: 1)SBO (small bowel obstruction) --POA -- Recommends continue NG tube and conservative approach for now - Patient denies prior surgeries.  Colonoscopy 2021 - diverticulosis, 1 polyp and internal hemorrhoids. -Gen Surgery consult from Dr. Collene Dawson appreciated--- 08/21/23 - Had few BMs overnight after Gastrografin contrast study for  small bowel follow-through on 08/19/2023  --Follow-up x-ray shows contrast in colon and improving bowel dilatation - Per general surgeon okay to hold off on surgical intervention at this time had more BMs (liquid) -Tolerating full liquid diet  -NG tube removed -Will advance to soft diet   2)Persistent Hypoglycemia--- due to #1 above -Hold PTA insulin  - Blood glucose was down to the 50s---Now stable  with iv Dextrose  - Will stop IV dextrose  solution once tolerating solid food well   3)AKI----acute kidney injury on CKD stage - IV--in the setting of dehydration with volume depletion and hypotension due to #1, compounded by losartan use PTA -  creatinine on admission= 12.37 - baseline creatinine =  3.1 (05/03/23)  ,  creatinine  13.40 ([place]) , -Creatinine trending down currently at 5.63 -Hold PTA losartan - renally adjust medications, avoid nephrotoxic agents / dehydration  / hypotension - Status post right IJ temporary catheter placement on 08/14/2023   - Nephrology input appreciated -Initiated HD on 08/15/23 (then had HD on 08/16/23 and 08/18/23) 08/21/23 - Having urine output -Per nephrologist  okay to hold off on further  hemodialysis at this time, hoping that patient will Not need additional hemodialysis going forward -- 4)Aspiration Pneumonia/CAP---POA - Suspect due to #1 above with persistent emesis - completed 7 days of Rocephin /Doxy - prn bronchodilators   5)HTN--was hypotensive due to volume depletion in setting of # 1 above -BP is now elevated -Okay to restart PTA amlodipine and Coreg  -Continue to hold Losartan   6)Hypocalcemia----Replaced - When corrected for hypoalbuminemia--- calcium  has actually normalized   7)Hyperphosphatemia ---in the setting of # 1 above, actually improving   Disposition: The patient is from: Home              Anticipated d/c is to: Home     Discharge Condition: ***  Follow UP   Follow-up Information     [facility].   Specialty: Home Health  Services Why: Agency will call to set up first home health visit.                 Consults obtained - ***  Diet and Activity recommendation:  As advised  Discharge Instructions    **** Discharge Instructions     Call MD for:  difficulty breathing, headache or visual disturbances   Complete by: As directed    Call MD for:  persistant dizziness or light-headedness   Complete by: As directed    Call MD for:  persistant nausea and vomiting   Complete by: As directed    Call MD for:  temperature >100.4   Complete by: As directed    Diet - low sodium heart healthy   Complete by: As directed    -- Soft diet advised, avoid constipation   Discharge instructions   Complete by: As directed    1)Very Low-salt diet advised---Less than 2 gm of Sodium per day advised----ok to use Mrs DASH salt substitute instead of Salt 2)Weigh yourself daily, call if you gain more than 3 pounds in 1 day or more than 5 pounds in 1 week as your diuretic medications may need to be adjusted 3)Avoid ibuprofen/Advil/Aleve /Motrin/Goody Powders/Naproxen /BC powders/Meloxicam/Diclofenac/Indomethacin and other  Nonsteroidal anti-inflammatory medications as these will make you more likely to bleed and can cause stomach ulcers, can also cause Kidney problems.  4) please note that there has been several changes to your medications 5) please Repeat CBC and BMP Blood Tests every Monday for the next 3 weeks starting on Monday, 08/25/2023 6)-- Soft diet advised, avoid constipation   Increase activity slowly   Complete by: As directed          Discharge Medications     Allergies as of 08/22/2023   No Known Allergies      Medication List     STOP taking these medications    aspirin  325 MG tablet Replaced by: aspirin  EC 81 MG tablet   losartan 100 MG tablet Commonly known as: COZAAR       TAKE these medications    acetaminophen  325 MG tablet Commonly known as: TYLENOL  Take 2 tablets (650 mg total) by mouth every 6 (six) hours as needed for mild pain (pain score 1-3) or fever (or Fever >/= 101).   albuterol  (2.5 MG/3ML) 0.083% nebulizer solution Commonly known as: PROVENTIL  Take 3 mLs (2.5 mg total) by nebulization every 2 (two) hours as needed for wheezing or shortness of breath.   amLODipine 5 MG tablet Commonly known as: NORVASC Take 1 tablet (5 mg total) by mouth daily. For BP What changed: additional instructions   aspirin  EC 81 MG tablet Take 1 tablet (81 mg total) by mouth daily with breakfast. Replaces: aspirin  325 MG tablet   carvedilol  12.5 MG tablet Commonly known as: COREG  Take 1 tablet (12.5 mg total) by mouth 2 (two) times daily with a meal. What changed: medication strength   ferrous sulfate 325 (65 FE) MG EC tablet Take 325 mg by mouth every Monday, Wednesday, and Friday.   insulin  aspart protamine- aspart (70-30) 100 UNIT/ML injection Commonly known as: NOVOLOG  MIX 70/30 Inject 25 Units into the skin 2 (two) times daily with a meal.   naloxone  4 MG/0.1ML Liqd nasal spray kit Commonly known as: NARCAN  In the event of a narcotic overdose, spray into  nostril and call 911.   ondansetron  4 MG tablet Commonly known as: Zofran  Take 1 tablet (4 mg total) by mouth daily as needed  for nausea or vomiting.   pantoprazole 40 MG tablet Commonly known as: PROTONIX Take 1 tablet (40 mg total) by mouth daily. Start taking on: August 23, 2023   Semaglutide(0.25 or 0.5MG /DOS) 2 MG/3ML Sopn Inject 0.25 mg into the skin every Monday.   simvastatin 20 MG tablet Commonly known as: ZOCOR Take 20 mg by mouth at bedtime.   Vitamin D 50 MCG (2000 UT) Caps Take 2,000 Units by mouth daily.        Major procedures and Radiology Reports - PLEASE review detailed and final reports for all details, in brief -   ***  DG Abd 1 View Result Date: 08/20/2023 CLINICAL DATA:  Small bowel obstruction EXAM: ABDOMEN - 1 VIEW COMPARISON:  08/19/2023 FINDINGS: Contrast medium observed in the colon and rectum. Abnormal dilated small bowel loops up to 5.7 cm in diameter, stable from 08/19/2023. Nasogastric tube noted.  Lumbar and lower thoracic spondylosis. IMPRESSION: 1. Contrast medium observed in the colon and rectum. 2. Abnormal dilated small bowel loops up to 5.7 cm in diameter, stable from 08/19/2023. 3. Nasogastric tube noted. Electronically Signed   By: Freida Jes M.D.   On: 08/20/2023 11:10   DG Abd Portable 1V-Small Bowel Obstruction Protocol-initial, 8 hr delay Result Date: 08/19/2023 CLINICAL DATA:  Bowel obstruction EXAM: PORTABLE ABDOMEN - 1 VIEW COMPARISON:  None Available. FINDINGS: Dilated loops of small bowel consistent with obstruction or ileus. There has been interval improvement. Oral contrast in the colon and rectosigmoid. No radiopaque stones in in. IMPRESSION: Improving small bowel dilatation consistent with obstruction or ileus. Electronically Signed   By: Sydell Eva M.D.   On: 08/19/2023 19:05   DG Abd 1 View Result Date: 08/18/2023 CLINICAL DATA:  Bowel obstruction EXAM: ABDOMEN - 1 VIEW COMPARISON:  August 16, 2023 FINDINGS: Multiple  persistently dilated bowel loops in the central portion of the abdomen could correlate with partial or early small bowel obstruction. IMPRESSION: Multiple persistently dilated bowel loops in the central portion of the abdomen could correlate with partial or early small bowel obstruction. Electronically Signed   By: Fredrich Jefferson M.D.   On: 08/18/2023 08:47   DG Abd Portable 1V Result Date: 08/16/2023 CLINICAL DATA:  Enteric catheter placement EXAM: PORTABLE ABDOMEN - 1 VIEW COMPARISON:  08/13/2023 FINDINGS: Frontal view of the lower chest and upper abdomen demonstrates enteric catheter passing below diaphragm, tip overlying the gastric fundus. Right internal jugular catheter tip overlies atriocaval junction. Lung bases are clear. Nonspecific gaseous distention of the bowel is noted. IMPRESSION: 1. Enteric catheter tip projecting over gastric fundus. Electronically Signed   By: Bobbye Burrow M.D.   On: 08/16/2023 13:14   DG CHEST PORT 1 VIEW Result Date: 08/16/2023 CLINICAL DATA:  [id number] Encounter for nasogastric (NG) tube placement [id number] EXAM: PORTABLE CHEST 1 VIEW COMPARISON:  August 14, 2023 FINDINGS: Incomplete visualization of the abdomen. Enteric tube is coiled over the distal esophagus with tip coursing superiorly towards the neck beyond the field of view. RIGHT IJ CVC tip terminates over the inferior SVC. Gaseous distension of visualized bowel loops. IMPRESSION: Enteric tube is coiled over the distal esophagus with tip coursing superiorly towards the neck beyond the field of view. Recommend repositioning. Electronically Signed   By: Clancy Crimes M.D.   On: 08/16/2023 12:15   DG Chest Port 1 View Result Date: 08/14/2023 CLINICAL DATA:  Dialysis catheter placement EXAM: PORTABLE CHEST 1 VIEW COMPARISON:  January nineteen two FINDINGS: Right IJ dialysis catheter tip in the cavoatrial junction.  No pneumothorax. Heart and mediastinum normal without significant infiltrates. Minimal left lower lobe  hypoventilatory atelectatic changes Nasogastric tube in the body of the stomach. IMPRESSION: Dialysis catheter as described Electronically Signed   By: Fredrich Jefferson M.D.   On: 08/14/2023 16:20   DG Abd Portable 1 View Result Date: 08/13/2023 CLINICAL DATA:  NG tube placement EXAM: PORTABLE ABDOMEN - 1 VIEW COMPARISON:  CT today FINDINGS: NG tube tip is in the stomach. Low lung volumes. Mildly dilated bowel in the upper abdomen. IMPRESSION: NG tube in the stomach. Electronically Signed   By: Janeece Mechanic M.D.   On: 08/13/2023 21:38   CT ABDOMEN PELVIS WO CONTRAST Result Date: 08/13/2023 CLINICAL DATA:  Abdominal pain EXAM: CT ABDOMEN AND PELVIS WITHOUT CONTRAST TECHNIQUE: Multidetector CT imaging of the abdomen and pelvis was performed following the standard protocol without IV contrast. RADIATION DOSE REDUCTION: This exam was performed according to the departmental dose-optimization program which includes automated exposure control, adjustment of the mA and/or kV according to patient size and/or use of iterative reconstruction technique. COMPARISON:  02/10/2019 FINDINGS: Lower chest: Bibasilar airspace opacities favor atelectasis although early infiltrate in the lingula difficult to exclude. No effusions. Hepatobiliary: No focal hepatic abnormality. Gallbladder unremarkable. Pancreas: No focal abnormality or ductal dilatation. Spleen: No focal abnormality.  Normal size. Adrenals/Urinary Tract: No adrenal abnormality. No focal renal abnormality. No stones or hydronephrosis. Urinary bladder is unremarkable. Stomach/Bowel: Stomach and small bowel into the pelvis are dilated and fluid-filled. Distal small bowel loops are decompressed. Findings compatible with mid to distal small bowel obstruction. Scattered diverticula throughout the colon. No evidence of active diverticulitis. Vascular/Lymphatic: Aortic atherosclerosis. No evidence of aneurysm or adenopathy. Reproductive: No visible focal abnormality. Other: No  free fluid or free air. Musculoskeletal: No acute bony abnormality. IMPRESSION: Dilated fluid-filled stomach and small bowel into the pelvis. Distal small bowel is decompressed. Findings compatible with mid to distal partial small bowel obstruction. Bibasilar airspace opacities, likely atelectasis although early infiltrate difficult to exclude in the lingula. Electronically Signed   By: Janeece Mechanic M.D.   On: 08/13/2023 20:45    Micro Results   *** Recent Results (from the past 240 hours)  Blood culture (routine x 2)     Status: None   Collection Time: 08/13/23  6:58 PM   Specimen: BLOOD  Result Value Ref Range Status   Specimen Description BLOOD RIGHT ANTECUBITAL  Final   Special Requests   Final    BOTTLES DRAWN AEROBIC AND ANAEROBIC Blood Culture adequate volume   Culture   Final    NO GROWTH 5 DAYS Performed at [facility], [address] [place], [place] [address]    Report Status 08/18/2023 FINAL  Final  Blood culture (routine x 2)     Status: None   Collection Time: 08/13/23  6:58 PM   Specimen: BLOOD  Result Value Ref Range Status   Specimen Description BLOOD LEFT ANTECUBITAL  Final   Special Requests   Final    BOTTLES DRAWN AEROBIC ONLY Blood Culture results may not be optimal due to an inadequate volume of blood received in culture bottles   Culture   Final    NO GROWTH 5 DAYS Performed at [facility], [address], [place], [place] [address]    Report Status 08/18/2023 FINAL  Final  MRSA Next Gen by PCR, Nasal     Status: None   Collection Time: 08/13/23 11:36 PM   Specimen: Nasal Mucosa; Nasal Swab  Result Value Ref  Range Status   MRSA by PCR Next Gen NOT DETECTED NOT DETECTED Final    Comment: (NOTE) The GeneXpert MRSA Assay (FDA approved for NASAL specimens only), is one component of a comprehensive MRSA colonization surveillance program. It is not intended to diagnose MRSA infection nor to guide or monitor treatment for MRSA infections. Test performance is  not FDA approved in patients less than 67 years old. Performed at [facility], [address], [place], [place] [address]     Today   Subjective    Jonothan Heberle today has no ***          Patient has been seen and examined prior to discharge   Objective   Blood pressure 129/61, pulse 64, temperature 98.3 F (36.8 C), temperature source Oral, resp. rate 13, height 5' 8 (1.727 m), weight 113.2 kg, SpO2 97%.   Intake/Output Summary (Last 24 hours) at 08/22/2023 1258 Last data filed at 08/22/2023 1158 Gross per 24 hour  Intake 720 ml  Output 400 ml  Net 320 ml    Exam Gen:- Awake Alert, no acute distress *** HEENT:- [place].AT, No sclera icterus Neck-Supple Neck,No JVD,.  Lungs-  CTAB , good air movement bilaterally CV- S1, S2 normal, regular Abd-  +ve B.Sounds, Abd Soft, No tenderness,    Extremity/Skin:- No  edema,   good pulses Psych-affect is appropriate, oriented x3 Neuro-no new focal deficits, no tremors ***   Data Review   CBC w Diff:  Lab Results  Component Value Date   WBC 7.3 08/22/2023   HGB 11.0 (L) 08/22/2023   HCT 34.5 (L) 08/22/2023   PLT 95 (L) 08/22/2023   LYMPHOPCT 18 08/13/2023   MONOPCT 15 08/13/2023   EOSPCT 0 08/13/2023   BASOPCT 0 08/13/2023    CMP:  Lab Results  Component Value Date   NA 146 (H) 08/22/2023   K 3.6 08/22/2023   CL 110 08/22/2023   CO2 25 08/22/2023   BUN 77 (H) 08/22/2023   CREATININE 4.97 (H) 08/22/2023   PROT 7.8 08/13/2023   ALBUMIN 2.4 (L) 08/22/2023   BILITOT 0.7 08/13/2023   ALKPHOS 61 08/13/2023   AST 18 08/13/2023   ALT 25 08/13/2023  .  Total Discharge time is about 33 minutes  Colin Dawley M.D on 08/22/2023 at 12:58 PM  Go to [email or web address] -  for contact info  [facility] - Office  [phone]

## 2023-08-22 NOTE — TOC Transition Note (Signed)
 Transition of Care [facility]) - Discharge Note   Patient Details  Name: Joseph Conrad MRN: [id number] Date of Birth: [birth year]  [facility] CM/SW Contact:  Joseph Conrad, LCSWA Phone Number: 08/22/2023, 10:49 AM   Clinical Narrative:    CSW updated by MD that pt will likely need HH. CSW spoke with pt at bedside to complete assessment. Pt states that he lives with his sister. Pt states that he is able to complete his ADLs when needed. Pt drives when able and his family will also provide transport when needed. Pt has had HH in the past but cannot remember agency. Pt states that he is interested in [facility] PT at this time and does not have an agency preference. CSW spoke to Joseph Conrad with [facility] who states they can accept referral, she has been updated that pt may D/C home today. HH agency info added to AVS for pt to review. CSW requested that MD place [facility] PT orders. Pt states he has a cane and walker to use if needed. TOC signing off.   Final next level of care: Home w Home Health Services Barriers to Discharge: Barriers Resolved   Patient Goals and CMS Choice Patient states their goals for this hospitalization and ongoing recovery are:: return home CMS Medicare.gov Compare Post Acute Care list provided to:: Patient Choice offered to / list presented to : Patient      Discharge Placement                       Discharge Plan and Services Additional resources added to the After Visit Summary for                            [name] Arranged: PT HH Agency: [facility]) Date [facility] Agency Contacted: 08/22/23   Representative spoke with at [facility] Agency: Joseph Conrad  Social Drivers of Health (SDOH) Interventions SDOH Screenings   Tobacco Use: Low Risk  (05/03/2023)     Readmission Risk Interventions     No data to display

## 2023-08-22 NOTE — Progress Notes (Signed)
 SWOT RN went over discharge instructions, medication education with patient and family member. All questions answered and patient expressed full understanding of instructions and education. IV's removed with no complications. Patient tolerated PO meds and diet well, appetite good. Patient up out of bed with supervision and ambulated around unit with walker. Gait steady. No complaints of pain, shortness of breath, chest pain, dizziness, nausea or vomiting. Patient discharged with all belongings for home via car.

## 2023-08-22 NOTE — Progress Notes (Signed)
 [facility]   Doing well. On diet. No issues. No pain.  BP 129/61   Pulse 64   Temp 98.3 F (36.8 C) (Oral)   Resp 13   Ht 5' 8 (1.727 m)   Wt 113.2 kg   SpO2 97%   BMI 37.95 kg/m  Soft, nondistended nontender  Patient with resolved SBO. Doing well.  Diet as tolerated Keep stools regular and soft Dialysis catheter out   Follow up with PCP  Deena Farrier, MD [facility] [address] Anise Barlow [place], [place] [address] [phone] (office)

## 2023-08-22 NOTE — Discharge Instructions (Signed)
 1)Very Low-salt diet advised---Less than 2 gm of Sodium per day advised----ok to use Mrs DASH salt substitute instead of Salt 2)Weigh yourself daily, call if you gain more than 3 pounds in 1 day or more than 5 pounds in 1 week as your diuretic medications may need to be adjusted 3)Avoid ibuprofen/Advil/Aleve /Motrin/Goody Powders/Naproxen /BC powders/Meloxicam/Diclofenac/Indomethacin and other Nonsteroidal anti-inflammatory medications as these will make you more likely to bleed and can cause stomach ulcers, can also cause Kidney problems.  4) please note that there has been several changes to your medications 5) please Repeat CBC and BMP Blood Tests every Monday for the next 3 weeks starting on Monday, 08/25/2023 6)-- Soft diet advised, avoid constipation

## 2023-09-19 ENCOUNTER — Encounter: Payer: Self-pay | Admitting: Advanced Practice Midwife
